# Patient Record
Sex: Female | Born: 1980 | Race: Black or African American | Hispanic: No | Marital: Single
Health system: Midwestern US, Community
[De-identification: ages and names within clinical notes are randomized; demographics above are authoritative.]

## PROBLEM LIST (undated history)

## (undated) DIAGNOSIS — I10 Essential (primary) hypertension: Secondary | ICD-10-CM

## (undated) DIAGNOSIS — Z0000 Encounter for general adult medical examination without abnormal findings: Secondary | ICD-10-CM

## (undated) DIAGNOSIS — E042 Nontoxic multinodular goiter: Secondary | ICD-10-CM

## (undated) DIAGNOSIS — M25519 Pain in unspecified shoulder: Secondary | ICD-10-CM

## (undated) DIAGNOSIS — F900 Attention-deficit hyperactivity disorder, predominantly inattentive type: Secondary | ICD-10-CM

## (undated) DIAGNOSIS — E039 Hypothyroidism, unspecified: Secondary | ICD-10-CM

## (undated) DIAGNOSIS — A5901 Trichomonal vulvovaginitis: Secondary | ICD-10-CM

## (undated) DIAGNOSIS — E041 Nontoxic single thyroid nodule: Secondary | ICD-10-CM

## (undated) DIAGNOSIS — N644 Mastodynia: Secondary | ICD-10-CM

## (undated) DIAGNOSIS — R635 Abnormal weight gain: Secondary | ICD-10-CM

## (undated) DIAGNOSIS — M170 Bilateral primary osteoarthritis of knee: Secondary | ICD-10-CM

## (undated) DIAGNOSIS — Z3040 Encounter for surveillance of contraceptives, unspecified: Secondary | ICD-10-CM

## (undated) DIAGNOSIS — Z131 Encounter for screening for diabetes mellitus: Secondary | ICD-10-CM

## (undated) DIAGNOSIS — F32A Depression, unspecified: Secondary | ICD-10-CM

## (undated) DIAGNOSIS — F419 Anxiety disorder, unspecified: Secondary | ICD-10-CM

## (undated) DIAGNOSIS — E119 Type 2 diabetes mellitus without complications: Secondary | ICD-10-CM

## (undated) DIAGNOSIS — E079 Disorder of thyroid, unspecified: Secondary | ICD-10-CM

## (undated) HISTORY — PX: LAPAROSCOPIC GASTRIC SLEEVE RESECTION: SHX5895

---

## 2009-12-12 MED ADMIN — medroxyPROGESTERone (DEPO-PROVERA) injection 150 mg: 150 mg | INTRAMUSCULAR | @ 21:00:00 | NDC 59762453801

## 2009-12-12 NOTE — Progress Notes (Signed)
Date last pap: 04/04/2009.  Date of last Bone Mineral Density:01/28/2008  Last Depo-Provera: 09/12/2009.  Serum HCG indicated? N/A.  Depo-Provera 150 mg IM given by: SMM.    Patient tolerated injection well with no reaction in clinic.

## 2009-12-12 NOTE — Progress Notes (Signed)
Subjective:      Patient ID: Christine Lynn is a 28 y.o. female.    HPI  Patient presents for Depo Provera injection. No voiced complaints. Prefers to continue Depo Provera.    Review of Systems    Objective:   Physical Exam    Assessment:      Contraceptive surveillance      Plan:      Depo Provera 150mg  IM  RTO in 12 weeks for next injection

## 2010-03-09 LAB — URINALYSIS WITH MICROSCOPIC
Bilirubin, Urine: NEGATIVE
Glucose, UA: NEGATIVE mg/dL
Ketones, Urine: NEGATIVE mg/dL
Leukocyte Esterase, Urine: NEGATIVE
Nitrite, Urine: NEGATIVE
Protein, UA: NEGATIVE mg/dL
Specific Gravity, UA: >=1.03 (ref <=1.035)
Urobilinogen, Urine: 0.2 U/dL (ref 0.2–1.0)
pH, UA: 5 (ref 5.0–8.5)

## 2010-03-09 LAB — POCT URINALYSIS DIPSTICK
Bilirubin, UA: NEGATIVE
Glucose, UA POC: NEGATIVE
Ketones, UA: NEGATIVE
Leukocytes, UA: NEGATIVE
Nitrite, UA: NEGATIVE
Protein, UA POC: NEGATIVE
Spec Grav, UA: 1.03
Urobilinogen, UA: NEGATIVE
pH, UA: 5

## 2010-03-09 MED ADMIN — medroxyPROGESTERone (DEPO-PROVERA) injection 150 mg: 150 mg | INTRAMUSCULAR | @ 21:00:00

## 2010-03-09 NOTE — Progress Notes (Signed)
Subjective:      Patient ID: Christine Lynn is a 29 y.o. female.    HPI  G1 P0 [place] 1 female presents for annual gyn exam and Depo Provera. She would like to continue using Depo Provera.She has used Depo Provera for approximately 10 years. Taking Calcium bid as instructed. Menarche age 73.  She complains of urinary frequency, occasional urgency. No dysuria. Voids small amounts. Positive for nocturia 3-4 times. Symptoms for past 2 months.  Denies pain, vaginal discharge. No bleeding vaginally. History of abnormal pap smears with negative HPV; no colposcopy done. History of chlamydia and trichomoniasis; treated. Sexually active in a monogamous relationship. Last BMD was 1/09, normal.    Review of Systems   Constitutional: Positive for appetite change ("hungry all the time") and unexpected weight change (10 lb weight gain since December 2010).        Complains of excessive thirst, hunger, urination.    Eyes: Negative.    Respiratory: Negative.    Cardiovascular: Negative.    Gastrointestinal: Negative.    Genitourinary: Positive for urgency, frequency and difficulty urinating. Negative for dysuria and flank pain.   Musculoskeletal: Negative.    Skin: Negative.    Neurological: Positive for headaches.   Hematological: Negative.    Psychiatric/Behavioral: The patient is nervous/anxious.        Objective:   Physical Exam   Constitutional: She is oriented to person, place, and time. She appears well-developed and well-nourished.        Obese     HENT:   Head: Normocephalic and atraumatic.   Neck: Normal range of motion. Neck supple. Thyromegaly present.   Cardiovascular: Normal rate and regular rhythm.    Murmur (faint gr 1/6 sytolic at apex and A2) heard.  Pulmonary/Chest: Effort normal and breath sounds normal. Right breast exhibits no inverted nipple, no mass, no nipple discharge, no skin change and no tenderness. Left breast exhibits no inverted nipple, no mass, no nipple discharge, no skin change and no  tenderness. Breasts are symmetrical.   Abdominal: Soft. She exhibits no distension and no mass. No tenderness.   Genitourinary: Vagina normal and uterus normal. There is no rash, tenderness or lesion on the right labia. There is no rash, tenderness or lesion on the left labia. Cervix exhibits no motion tenderness, no discharge and no friability. Right adnexum displays no mass, no tenderness and no fullness. Left adnexum displays no mass, no tenderness and no fullness. No discharge found.        Tender on palpation over bladder wall. Multistick urine POC is positive for small to moderate blood.     Musculoskeletal: Normal range of motion.   Lymphadenopathy:     She has no cervical adenopathy.     She has no axillary adenopathy.   Neurological: She is alert and oriented to person, place, and time.   Skin: Skin is warm and dry.   Psychiatric: She has a normal mood and affect.       Assessment:      Annual gyn exam, breast screening, std screening  Screen for Diabetes Mellitus  Screen for osteoporosis  Screen for thyroid disease  Contraceptive surveillance  UTI screening      Plan:   Thin prep pap smear, GC, CT probes  Depo Provera 150 mg IM; continue Calcium bid  FBS, TFT's  Dexa scan  Multistick urine, UA, c&s  Cipro 250 mg bid for 3 days  Follow up with PCP for preventive medicine management  RTO 12 weeks for next Depo Provera injection

## 2010-03-09 NOTE — Progress Notes (Signed)
Date last pap: 04/04/09.  Date of last Bone Mineral Density:01/28/08  Last Depo-Provera: 12/12/09.  Urine HCG indicated? no.  Depo-Provera 150 mg IM given by: BM left arm.      Marland Kitchen

## 2010-03-12 LAB — GLUCOSE, RANDOM: Glucose: 92 mg/dL (ref 70–110)

## 2010-03-12 LAB — CHLAMYDIA PROBE RNA

## 2010-03-12 LAB — TSH: TSH: 1.498 u[IU]/mL (ref 0.350–5.000)

## 2010-03-12 LAB — GC PROBE RNA

## 2010-03-12 LAB — T4, FREE: T4 Free: 1.29 ng/dL (ref 0.89–1.76)

## 2010-06-01 NOTE — Progress Notes (Signed)
This encounter was created in error - please disregard.

## 2010-06-05 MED ADMIN — medroxyPROGESTERone (DEPO-PROVERA) injection 150 mg: 150 mg | INTRAMUSCULAR | @ 15:00:00

## 2010-06-05 NOTE — Progress Notes (Signed)
Date last pap: 03/09/10.  Date of last Bone Mineral Density:03/27/10  Last Depo-Provera: 03/09/10.  Urine HCG indicated? no.  Depo-Provera 150 mg IM given by: BM right arm.      Marland Kitchen

## 2010-08-28 MED ADMIN — medroxyPROGESTERone (DEPO-PROVERA) injection 150 mg: 150 mg | INTRAMUSCULAR | @ 15:00:00 | NDC 59762453809

## 2010-08-28 NOTE — Progress Notes (Signed)
Date last pap: 03/09/2010 Negative (Annual) .  Date of last Bone Mineral Density:03/27/2010  Last Depo-Provera: 06/05/2010.  Urine HCG indicated? na.  Depo-Provera 150 mg IM given by: Octavia Bruckner.      Marland Kitchen

## 2010-11-19 ENCOUNTER — Encounter

## 2010-11-19 MED ADMIN — medroxyPROGESTERone (DEPO-PROVERA) injection 150 mg: 150 mg | INTRAMUSCULAR | @ 18:00:00 | NDC 59762453809

## 2010-11-19 NOTE — Progress Notes (Signed)
Date last pap: 03/09/2010 Negative.  Date of last Bone Mineral Density:08/2010  Last Depo-Provera:  08/28/2010.  Urine HCG indicated? na.  Depo-Provera 150 mg IM given by: Octavia Bruckner.      Marland Kitchen

## 2011-02-12 MED ADMIN — medroxyPROGESTERone (DEPO-PROVERA) injection 150 mg: INTRAMUSCULAR | @ 15:00:00 | NDC 59762453809

## 2011-02-12 NOTE — Progress Notes (Signed)
Date last pap: 03/09/10.  Date of last Bone Mineral Density:08/2010  Last Depo-Provera: 11/19/10.  Urine HCG indicated? No.  Depo-Provera 150 mg IM given by: bm left arm.      Marland Kitchen

## 2011-05-09 MED ADMIN — medroxyPROGESTERone (DEPO-PROVERA) injection 150 mg: INTRAMUSCULAR | @ 15:00:00 | NDC 59762453809

## 2011-05-09 NOTE — Progress Notes (Signed)
Date of last Pap   03-09-10  Date of last Bone mineral Density   03-27-10  Last Depo   02-12-11  Urine HCG   n/a  Depo Provera given by   MEW

## 2011-06-24 ENCOUNTER — Inpatient Hospital Stay
Admit: 2011-06-24 | Discharge: 2011-06-24 | Disposition: A | Discharge status: Home / Self Care | Attending: Emergency Medicine

## 2011-06-24 MED ORDER — AMOXICILLIN-POT CLAVULANATE 875-125 MG PO TABS
875-125 | ORAL_TABLET | Freq: Two times a day (BID) | ORAL | Status: AC
Start: 2011-06-24 — End: 2011-07-04

## 2011-06-24 MED ADMIN — diptheria-tetanus toxoids 6.7-5 LFU/0.5ML injection 0.5 mL: INTRAMUSCULAR | @ 16:00:00 | NDC 49281027810

## 2011-06-24 NOTE — ED Notes (Signed)
Sling applied- verbal ok by dr Trina Ao for temporary relief of pain  To follow with [facility] (number put on discharge forms)    Linde Gillis, RN  06/24/11 1243

## 2011-06-24 NOTE — ED Notes (Addendum)
Antibiotic changed to doxycycline 100mg  bid x10 days per dr Trina Ao, pt not able to afford augmentin.    Bayard Beaver, RN  06/24/11 1436    Bayard Beaver, RN  06/24/11 [phone]
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
## 2011-06-26 LAB — HEPATITIS B SURFACE ANTIBODY: Hep B S Ab: REACTIVE

## 2011-06-26 LAB — HEPATITIS C ANTIBODY: Hepatitis C Ab: NONREACTIVE

## 2011-06-26 LAB — HIV SCREEN: HIV-1/HIV-2 Ab: NONREACTIVE

## 2011-06-26 LAB — HEPATITIS B SURFACE ANTIGEN: Hepatitis B Surface Ag: NONREACTIVE
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
## 2011-08-06 MED ADMIN — medroxyPROGESTERone (DEPO-PROVERA) injection 150 mg: INTRAMUSCULAR | @ 20:00:00 | NDC 59762453809

## 2011-08-06 NOTE — Progress Notes (Signed)
Date of last Pap   03-09-10  Date of last Bone mineral Density   03-27-10  Last Depo   05-09-11  Urine HCG   n/a  Depo Provera given by   MEW

## 2011-08-06 NOTE — Progress Notes (Signed)
Subjective:      Patient ID: Christine Lynn is a 30 y.o. female.    HPI :Routine Gyn Exam: Patient here for annual exam & medroxy progesterone injection.    Current Complaints: None;Camille would like to discontinue medroxy progesterone use after today's injection & resume OC use in 12 weeks.She denies any history of chest pain,head ache or DVT. PCB: Negative.  GI complaints:Negative. GU complaints:Negative.No c/o vaginal discharge, burning, itching, odor . Pain:0/10.     Gynecologic History  Menarche:@ age 42  No LMP recorded. Patient has had an injection.  Contraception:medroxy progesterone 150 mgm IM every 12 weeks  Last Pap: 02/2010  Results: normal  Bone Mineral Density screening:03/07/2010  Colonoscopy:N/A  Familial history of breast/ovarian cancer:Negative    Obstetric History  Gravida:1  Para: 0  AB:1      Review of Systems   Constitutional: Negative.    HENT: Negative.    Eyes: Negative.    Respiratory: Negative for cough and shortness of breath.    Cardiovascular: Negative for chest pain, palpitations and leg swelling.   Gastrointestinal: Negative.    Genitourinary: Negative for vaginal discharge, pelvic pain and dyspareunia.   Musculoskeletal: Negative.    Neurological: Negative.    Hematological: Negative.    Psychiatric/Behavioral: The patient is nervous/anxious.        Objective:   Physical Exam   Constitutional: She is oriented to person, place, and time. She appears well-developed and well-nourished.        Anxious     HENT:   Head: Normocephalic.   Eyes: Conjunctivae and EOM are normal. Pupils are equal, round, and reactive to light. No scleral icterus.   Neck: Normal range of motion. Neck supple. No JVD present.   Cardiovascular: Normal rate and regular rhythm.    Pulmonary/Chest: Effort normal and breath sounds normal. Right breast exhibits no inverted nipple, no mass, no nipple discharge, no skin change and no tenderness. Left breast exhibits no inverted nipple, no mass, no nipple discharge, no  skin change and no tenderness. Breasts are symmetrical.   Abdominal: Soft. There is no tenderness. There is no guarding.   Genitourinary: Vagina normal and uterus normal. There is no tenderness or lesion on the right labia. There is no tenderness or lesion on the left labia. Uterus is not tender. Cervix exhibits no motion tenderness, no discharge and no friability. Right adnexum displays no tenderness and no fullness. Left adnexum displays no tenderness and no fullness. No erythema around the vagina. No vaginal discharge found.   Musculoskeletal: Normal range of motion. She exhibits no edema and no tenderness.   Neurological: She is alert and oriented to person, place, and time.   Skin: Skin is warm and dry.   Psychiatric: Her speech is normal. Judgment and thought content normal. Her mood appears anxious. She is withdrawn. Cognition and memory are normal.        Nyeli had difficulty cooperating during pelvic exam;she was tense.       Assessment:    Annual gyn exam, breast and std screening  Here for medroxy Progesterone injection.  Family Planning;requests OC prescription in 12 weeks  History of anxiety        Plan:    Thin prep pap smear,HPV screening, GC, CT probes  Medroxy progesterone 150 mgm IM today  Start Junel Fe 1/20 use on 11/03/2011  Calcium 600 mg with vitamin D po bid  RTO in 12 weeks if medroxy progesterone use is desired or annually if  OC use is started

## 2011-08-07 LAB — CHLAMYDIA PROBE RNA

## 2011-08-07 LAB — GC PROBE RNA

## 2011-08-12 NOTE — Telephone Encounter (Signed)
Glennice notified of pap result & need for colposcopy.Colposcopy scheduled for 09/16/11.

## 2011-08-13 LAB — HPV, HIGH RISK: HPV, High Risk: POSITIVE

## 2011-09-16 NOTE — Patient Instructions (Addendum)
[facility] Going Instructions    No sexual relations for 2 weeks    No tampons for  2 weeks    No douching    May have spotting/discharge for 1-7 days    Return to office in 3 months for pap smear

## 2011-09-21 NOTE — Progress Notes (Signed)
Subjective:      Patient ID: Christine Lynn is a 30 y.o. female.    HPI    Indication for colposcopy:   LGSIL on pap smear.    Patient counseled prior to colposcopy re indication, procedure, possible complications, expected findings and subsequent management based on biopsy results. All questions answered.    Review of Systems    Objective:   Physical Exam   Genitourinary:            Specimen: ECC, Cervical biopsies from 3 & 10 o'clock        Assessment:      Colposcopic impression: Agrees with LGSIL      Plan:      Follow up in 3 months for repeat pap smear unless biopsy results require counseling for LEEP

## 2011-10-29 NOTE — Telephone Encounter (Signed)
S/P colposcopy 09/16/11 for evaluation of LGSIL pap.Colposcopy & biopsy results were negative.3 mont f/u pap advised.Sobia states that she wants to discontinue medroxy progesterone & start OC's.Initial dose due 11/03/11.Informed [place] of [facility] office closing effective 11/29/11.Instructed her to make appointment with another women's health Ccare facility for a f/u pap after 12/16/11.

## 2012-02-28 LAB — CBC WITH AUTO DIFFERENTIAL
Absolute Eos #: 0.11 E9/L (ref 0.05–0.50)
Absolute Lymph #: 1.73 E9/L (ref 1.50–4.00)
Absolute Mono #: 0.42 E9/L (ref 0.10–0.95)
Absolute Neut #: 2.91 E9/L (ref 1.80–7.30)
Basophils Absolute: 0.04 E9/L (ref 0.00–0.20)
Basophils: 1 % (ref 0–2)
Eosinophils %: 2 % (ref 0–6)
Hematocrit: 36.4 % (ref 34.0–48.0)
Hemoglobin: 12 g/dL (ref 11.5–15.5)
Lymphocytes: 33 % (ref 20–42)
MCH: 28.8 pg (ref 26.0–35.0)
MCHC: 33 % (ref 32.0–34.5)
MCV: 87.2 fL (ref 80.0–99.9)
MPV: 9.5 fL (ref 7.0–12.0)
Monocytes: 8 % (ref 2–12)
Platelets: 246 E9/L (ref 130–450)
RBC: 4.18 E12/L (ref 3.50–5.50)
RDW: 14 fL (ref 11.5–15.0)
Seg Neutrophils: 56 % (ref 43–80)
WBC: 5.2 E9/L (ref 4.5–11.5)

## 2012-02-28 LAB — COMPREHENSIVE METABOLIC PANEL
ALT: 18 U/L (ref 10–49)
AST: 19 U/L (ref 0–33)
Albumin: 4.3 g/dL (ref 3.2–4.8)
Alkaline Phosphatase: 62 U/L (ref 45–129)
BUN: 11 mg/dL (ref 6–20)
CO2: 28 mmol/L (ref 20–31)
Calcium: 9.8 mg/dL (ref 8.6–10.5)
Chloride: 110 mmol/L — ABNORMAL HIGH (ref 99–109)
Creatinine: 0.9 mg/dL (ref 0.5–1.1)
Glucose: 107 mg/dL (ref 70–110)
Potassium: 4.6 mmol/L (ref 3.5–5.5)
Sodium: 144 mmol/L (ref 132–146)
Total Bilirubin: 0.3 mg/dL (ref 0.3–1.2)
Total Protein: 7.2 g/dL (ref 5.7–8.2)

## 2012-02-28 LAB — TSH: TSH: 2.187 u[IU]/mL (ref 0.350–5.000)

## 2012-02-28 LAB — LIPID PANEL
Cholesterol: 232 mg/dL — ABNORMAL HIGH (ref 0–199)
HDL: 63 mg/dL (ref >40.0)
LDL Calculated: 155 mg/dL — ABNORMAL HIGH (ref 0–99)
Triglycerides: 72 mg/dL (ref 0–149)

## 2012-02-28 LAB — VITAMIN D 25 HYDROX, D2 & D3: Vitamin D2 And D3, Total: 12 ng/mL — ABNORMAL LOW (ref 30–100)

## 2012-02-28 LAB — GFR CALCULATED: Gfr Calculated: >60 mL/min/{1.73_m2} (ref >=60)

## 2012-02-29 ENCOUNTER — Inpatient Hospital Stay
Admit: 2012-02-29 | Discharge: 2012-02-29 | Disposition: A | Discharge status: Home / Self Care | Attending: Emergency Medicine

## 2012-02-29 LAB — CBC WITH AUTO DIFFERENTIAL
Absolute Eos #: 0 E9/L — ABNORMAL LOW (ref 0.05–0.50)
Absolute Lymph #: 0.86 E9/L — ABNORMAL LOW (ref 1.50–4.00)
Absolute Mono #: 0.23 E9/L (ref 0.10–0.95)
Absolute Neut #: 4.62 E9/L (ref 1.80–7.30)
Basophils Absolute: 0 E9/L (ref 0.00–0.20)
Basophils: 0 % (ref 0–2)
Eosinophils %: 0 % (ref 0–6)
Hematocrit: 38.5 % (ref 34.0–48.0)
Hemoglobin: 12.6 g/dL (ref 11.5–15.5)
Lymphocytes: 15 % — ABNORMAL LOW (ref 20–42)
MCH: 28.6 pg (ref 26.0–35.0)
MCHC: 32.8 % (ref 32.0–34.5)
MCV: 87.2 fL (ref 80.0–99.9)
MPV: 9.8 fL (ref 7.0–12.0)
Monocytes: 4 % (ref 2–12)
Platelet Estimate: NORMAL
Platelets: 282 E9/L (ref 130–450)
RBC: 4.41 E12/L (ref 3.50–5.50)
RDW: 12.8 fL (ref 11.5–15.0)
Seg Neutrophils: 81 % — ABNORMAL HIGH (ref 43–80)
WBC: 5.7 E9/L (ref 4.5–11.5)

## 2012-02-29 LAB — URINALYSIS WITH MICROSCOPIC
Glucose, Ur: NEGATIVE mg/dL
Ketones, Urine: 15 mg/dL — AB
Leukocyte Esterase, Urine: NEGATIVE
Nitrite, Urine: NEGATIVE
Protein, UA: 30 mg/dL — AB
Specific Gravity, UA: >=1.03 (ref <=1.035)
Urobilinogen, Urine: 1 E.U./dL (ref 0.2–1.0)
pH, UA: 5.5 (ref 5.0–8.5)

## 2012-02-29 LAB — COMPREHENSIVE METABOLIC PANEL
ALT: 17 U/L (ref 10–49)
AST: 22 U/L (ref 0–33)
Albumin: 4.4 g/dL (ref 3.2–4.8)
Alkaline Phosphatase: 58 U/L (ref 45–129)
BUN: 12 mg/dL (ref 6–20)
CO2: 26 mmol/L (ref 20–31)
Calcium: 9.6 mg/dL (ref 8.6–10.5)
Chloride: 105 mmol/L (ref 99–109)
Creatinine: 0.8 mg/dL (ref 0.5–1.1)
Glucose: 104 mg/dL (ref 70–110)
Potassium: 4.1 mmol/L (ref 3.5–5.5)
Sodium: 140 mmol/L (ref 132–146)
Total Bilirubin: 0.5 mg/dL (ref 0.3–1.2)
Total Protein: 7.7 g/dL (ref 5.7–8.2)

## 2012-02-29 LAB — POC PREGNANCY UR-QUAL: Preg Test, Ur: NEGATIVE

## 2012-02-29 LAB — GFR CALCULATED: Gfr Calculated: >60 mL/min/{1.73_m2} (ref >=60)

## 2012-02-29 LAB — LIPASE: Lipase: 49 U/L (ref 6–51)

## 2012-02-29 MED ORDER — PROMETHAZINE HCL 25 MG PO TABS
25 MG | ORAL_TABLET | ORAL | Status: AC
Start: 2012-02-29 — End: 2012-03-05

## 2012-02-29 MED ADMIN — sodium chloride 0.9% bolus: INTRAVENOUS | @ 08:00:00 | NDC 00338004904

## 2012-02-29 MED ADMIN — ondansetron (ZOFRAN) injection 8 mg: INTRAVENOUS | @ 08:00:00 | NDC 00641607801

## 2012-02-29 MED FILL — ONDANSETRON HCL 4 MG/2ML IJ SOLN: 4 MG/2ML | INTRAMUSCULAR | Qty: 4

## 2012-02-29 MED FILL — SODIUM CHLORIDE 0.9 % IV SOLN: 0.9 % | INTRAVENOUS | Qty: 1000

## 2012-02-29 NOTE — ED Provider Notes (Signed)
HPI Comments: Patient comes in with a complaint of nausea and vomiting that started yesterday at 6 PM. Patient denies any abdominal pain or diarrhea. She denies any fevers or chills. She denies any history of alcohol use or gallstones. Patient's last menstrual period was roughly 2 weeks ago. She states his had at least 6 or 7 episodes of emesis. She denies any dizziness or lightheadedness. No blood in the emesis.    Patient is a 31 y.o. female presenting with GI illness. The history is provided by the patient.   GI Problem  The primary symptoms include nausea and vomiting. Primary symptoms do not include fever, fatigue, abdominal pain, diarrhea, dysuria or rash.   The illness does not include chills or back pain.       Review of Systems   Constitutional: Negative for fever, chills and fatigue.   Eyes: Negative for visual disturbance.   Respiratory: Negative for shortness of breath.    Cardiovascular: Negative for chest pain.   Gastrointestinal: Positive for nausea and vomiting. Negative for abdominal pain, diarrhea and blood in stool.   Genitourinary: Negative for dysuria, urgency, frequency, hematuria, vaginal bleeding and vaginal discharge.   Musculoskeletal: Negative for back pain.   Skin: Negative for rash.   Neurological: Negative for dizziness, syncope and light-headedness.       Physical Exam   Nursing note and vitals reviewed.  Constitutional: She is oriented to person, place, and time. She appears well-developed and well-nourished. No distress.   HENT:   Head: Normocephalic and atraumatic.   Eyes: Conjunctivae are normal. Pupils are equal, round, and reactive to light.   Neck: Normal range of motion. Neck supple.   Cardiovascular: Normal rate, regular rhythm and normal heart sounds.    No murmur heard.  Pulmonary/Chest: Effort normal and breath sounds normal. No respiratory distress.   Abdominal: Soft. Bowel sounds are normal. There is no tenderness.   Neurological: She is alert and oriented to person,  place, and time.   Skin: Skin is warm and dry. No rash noted. She is not diaphoretic.       Procedures    MDM      --------------------------------------------- PAST HISTORY ---------------------------------------------  Past Medical History:  has a past medical history of Hypertension and Anxiety.    Past Surgical History:  has no past surgical history on file.    Social History:  reports that she has never smoked. She has never used smokeless tobacco. She reports that she does not drink alcohol or use illicit drugs.    Family History: family history includes Cancer in her maternal grandfather, maternal grandmother, and another family member and Diabetes in her mother and other family members.     The patient's home medications have been reviewed.    Allergies: Review of patient's allergies indicates no known allergies.    -------------------------------------------------- RESULTS -------------------------------------------------  Labs:  Results for orders placed during the hospital encounter of 02/29/12   COMPREHENSIVE METABOLIC PANEL       Result Value Range    Sodium 140  132 - 146 mmol/L    Potassium 4.1  3.5 - 5.5 mmol/L    Chloride 105  99 - 109 mmol/L    CO2 26  20 - 31 mmol/L    Glucose 104  70 - 110 mg/dL    BUN 12  6 - 20 mg/dL    Creatinine, Ser 0.8  0.5 - 1.1 mg/dL    Calcium 9.6  8.6 - 62.9 mg/dL  Total Protein 7.7  5.7 - 8.2 g/dL    Alb 4.4  3.2 - 4.8 g/dL    Alkaline Phosphatase 58  45 - 129 U/L    AST 22  0 - 33 U/L    Total Bilirubin 0.5  0.3 - 1.2 mg/dL    ALT 17  10 - 49 U/L   CBC WITH AUTO DIFFERENTIAL       Result Value Range    WBC 5.7  4.5 - 11.5 E9/L    RBC 4.41  3.50 - 5.50 E12/L    Hemoglobin 12.6  11.5 - 15.5 g/dL    Hematocrit 36.6  44.0 - 48.0 %    MCV 87.2  80.0 - 99.9 fL    MCH 28.6  26.0 - 35.0 pg    MCHC 32.8  32.0 - 34.5 %    RDW 12.8  11.5 - 15.0 fL    Platelets 282  130 - 450 E9/L    MPV 9.8  7.0 - 12.0 fL    Seg Neutrophils 81 (*) 43 - 80 %    Lymphocytes 15 (*) 20 - 42 %     Monocytes 4  2 - 12 %    Eosinophils 0  0 - 6 %    Basophils 0  0 - 2 %    Absolute Neut # 4.62  1.80 - 7.30 E9/L    Absolute Lymph # 0.86 (*) 1.50 - 4.00 E9/L    Absolute Mono # 0.23  0.10 - 0.95 E9/L    Absolute Eos # 0.00 (*) 0.05 - 0.50 E9/L    Basophils Absolute 0.00  0.00 - 0.20 E9/L    Platelet Estimate Normal      RBC Morphology Norm RBC morph     LIPASE       Result Value Range    Lipase 49  6 - 51 U/L   POC PREGNANCY UR-QUAL       Result Value Range    Preg Test, Ur negative      QC OK?       URINALYSIS WITH MICROSCOPIC       Result Value Range    Color, UA DKYELLOW  YELLOW    Clarity, UA CLOUDY  CLEAR    Glucose, Ur NEGATIVE  NEGATIVE mg/dL    Bilirubin Urine SMALL (*) NEGATIVE    Ketones, Urine 15 (*) NEGATIVE mg/dL    Specific Gravity, UA >=1.030  <=1.035    Blood, Urine LARGE (*) NEGATIVE    pH, UA 5.5  5.0 - 8.5    Protein, UA 30 (*) NEGATIVE mg/dL    Urobilinogen, Urine 1.0  0.2 - 1.0 E.U./dL    Nitrite, Urine NEGATIVE  NEGATIVE    Leukocyte Esterase, Urine NEGATIVE  NEGATIVE    WBC, UA 1-3  <5 /hpf    RBC, UA 1-3  <2 /hpf    Bacteria, UA FEW (*) NONE /hpf    Amorphous, UA MANY     GFR CALCULATED       Result Value Range    Gfr Calculated >60  >=60 ml/mn/1.73         ------------------------- NURSING NOTES AND VITALS REVIEWED ---------------------------  The nursing notes within the ED encounter and vital signs as below have been reviewed.   BP 141/96  Pulse 97  Temp(Src) 98.5 F (36.9 C) (Oral)  Resp 10  Ht 5\' 7"  (1.702 m)  Wt 264 lb (119.75 kg)  BMI 41.34 kg/m2  SpO2 100%  LMP 02/21/2012  Oxygen Saturation Interpretation: Normal      ------------------------------------------ PROGRESS NOTES ------------------------------------------  I have spoken with the patient and discussed today's results, in addition to providing specific details for the plan of care and counseling regarding the diagnosis and prognosis.  Their questions are answered at this time and they are agreeable with the  plan.    [id number]  Patient states her symptoms have improved with treatment.  She was tolerating by mouth fluids in the department without emesis.    --------------------------------- ADDITIONAL PROVIDER NOTES ---------------------------------  At this time the patient is without objective evidence of an acute process requiring hospitalization or inpatient management.  They have remained hemodynamically stable throughout their entire ED visit and are stable for discharge with outpatient follow-up.     The plan has been discussed in detail and they are aware of the specific conditions for emergent return, as well as the importance of follow-up.      New Prescriptions    PROMETHAZINE (PHENERGAN) 25 MG TABLET    Take 1 tablet by mouth every 4 hours for 5 days.       Diagnosis:  1. Nausea & vomiting        Disposition:  Patient's disposition: Discharge to home  Patient's condition is stable.          Nils Pyle, DO  02/29/12 [phone]

## 2012-02-29 NOTE — Discharge Instructions (Signed)
Nausea and Vomiting  Nausea is a sick feeling that often comes before throwing up (vomiting). Vomiting is a reflex where stomach contents come out of your mouth. Vomiting can cause severe loss of body fluids (dehydration). Children and elderly adults can become dehydrated quickly, especially if they also have diarrhea. Nausea and vomiting are symptoms of a condition or disease. It is important to find the cause of your symptoms.  CAUSES    Direct irritation of the stomach lining. This irritation can result from increased acid production (gastroesophageal reflux disease), infection, food poisoning, taking certain medicines (such as nonsteroidal anti-inflammatory drugs), alcohol use, or tobacco use.   Signals from the brain.These signals could be caused by a headache, heat exposure, an inner ear disturbance, increased pressure in the brain from injury, infection, a tumor, or a concussion, pain, emotional stimulus, or metabolic problems.   An obstruction in the gastrointestinal tract (bowel obstruction).   Illnesses such as diabetes, hepatitis, gallbladder problems, appendicitis, kidney problems, cancer, sepsis, atypical symptoms of a heart attack, or eating disorders.   Medical treatments such as chemotherapy and radiation.   Receiving medicine that makes you sleep (general anesthetic) during surgery.  DIAGNOSIS  Your caregiver may ask for tests to be done if the problems do not improve after a few days. Tests may also be done if symptoms are severe or if the reason for the nausea and vomiting is not clear. Tests may include:   Urine tests.   Blood tests.   Stool tests.   Cultures (to look for evidence of infection).   X-rays or other imaging studies.  Test results can help your caregiver make decisions about treatment or the need for additional tests.  TREATMENT  You need to stay well hydrated. Drink frequently but in small amounts.You may wish to drink water, sports drinks, clear broth, or eat frozen  ice pops or gelatin dessert to help stay hydrated.When you eat, eating slowly may help prevent nausea.There are also some antinausea medicines that may help prevent nausea.  HOME CARE INSTRUCTIONS    Take all medicine as directed by your caregiver.   If you do not have an appetite, do not force yourself to eat. However, you must continue to drink fluids.   If you have an appetite, eat a normal diet unless your caregiver tells you differently.   Eat a variety of complex carbohydrates (rice, wheat, potatoes, bread), lean meats, yogurt, fruits, and vegetables.   Avoid high-fat foods because they are more difficult to digest.   Drink enough water and fluids to keep your urine clear or pale yellow.   If you are dehydrated, ask your caregiver for specific rehydration instructions. Signs of dehydration may include:   Severe thirst.   Dry lips and mouth.   Dizziness.   Dark urine.   Decreasing urine frequency and amount.   Confusion.   Rapid breathing or pulse.  SEEK IMMEDIATE MEDICAL CARE IF:    You have blood or brown flecks (like coffee grounds) in your vomit.   You have black or bloody stools.   You have a severe headache or stiff neck.   You are confused.   You have severe abdominal pain.   You have chest pain or trouble breathing.   You do not urinate at least once every 8 hours.   You develop cold or clammy skin.   You continue to vomit for longer than 24 to 48 hours.   You have a fever.  MAKE SURE YOU:      Understand these instructions.   Will watch your condition.   Will get help right away if you are not doing well or get worse.  Document Released: 12/16/2005 Document Revised: 12/05/2011 Document Reviewed: 05/15/2011  [employer] Patient Information 2012 [employer].

## 2012-03-21 ENCOUNTER — Inpatient Hospital Stay
Admit: 2012-03-21 | Discharge: 2012-03-22 | Disposition: A | Discharge status: Home / Self Care | Attending: Emergency Medicine

## 2012-03-21 NOTE — ED Provider Notes (Signed)
HPI Comments: Patient is a 31 y/o female who presents to the ED with abdominal pain, nausea, and vomiting. Patient states she was seen here 2 weeks ago for the same thing, prescribed phenergan, however her symptoms continue. She states she is unable to hold anything down. She also reports a fever. She denies any diarrhea, dysuria, or vaginal discharge.    Patient is a 31 y.o. female presenting with abdominal pain. The history is provided by the patient.   Abdominal Pain  The primary symptoms of the illness include abdominal pain, nausea and vomiting. The primary symptoms of the illness do not include fever, shortness of breath, diarrhea or dysuria. The current episode started more than 2 days ago. The onset of the illness was gradual. The problem has not changed since onset.  The abdominal pain is generalized. The abdominal pain does not radiate. The severity of the abdominal pain is 5/10. The abdominal pain is relieved by nothing. The abdominal pain is exacerbated by eating and vomiting.   The patient states that she believes she is currently not pregnant. The patient has not had a change in bowel habit. Symptoms associated with the illness do not include chills, frequency or back pain.       Review of Systems   Constitutional: Negative for fever and chills.   HENT: Negative for ear pain, sore throat and sinus pressure.    Eyes: Negative for pain, discharge and redness.   Respiratory: Negative for cough, shortness of breath and wheezing.    Cardiovascular: Negative for chest pain.   Gastrointestinal: Positive for nausea, vomiting and abdominal pain. Negative for diarrhea and abdominal distention.   Genitourinary: Negative for dysuria and frequency.   Musculoskeletal: Negative for back pain and arthralgias.   Skin: Negative for rash and wound.   Neurological: Negative for weakness and headaches.   Hematological: Negative for adenopathy.   All other systems reviewed and are negative.        Physical Exam   Nursing  note and vitals reviewed.  Constitutional: She is oriented to person, place, and time. She appears well-developed and well-nourished. No distress.   HENT:   Head: Normocephalic and atraumatic.   Nose: Nose normal.   Mouth/Throat: Oropharynx is clear and moist.   Eyes: Conjunctivae and EOM are normal. Pupils are equal, round, and reactive to light.   Neck: Normal range of motion. Neck supple.   Cardiovascular: Normal rate, regular rhythm, normal heart sounds and intact distal pulses.    No murmur heard.  Pulmonary/Chest: Effort normal and breath sounds normal. No respiratory distress. She has no wheezes. She has no rales.   Abdominal: Soft. Bowel sounds are normal. She exhibits no distension. There is tenderness (Diffuse). There is no rebound and no guarding.   Musculoskeletal: Normal range of motion. She exhibits no edema.   Neurological: She is alert and oriented to person, place, and time.   Skin: Skin is warm and dry. No rash noted. She is not diaphoretic.   Psychiatric: She has a normal mood and affect.       Procedures    MDM    Time: 0300  Re-evaluation.  Patient???s symptoms are improving  Repeat physical examination is improved    --------------------------------------------- PAST HISTORY ---------------------------------------------  Past Medical History:  has a past medical history of Hypertension and Anxiety.    Past Surgical History:  has no past surgical history on file.    Social History:  reports that she has never smoked. She has  never used smokeless tobacco. She reports that she does not drink alcohol or use illicit drugs.    Family History: family history includes Cancer in her maternal grandfather, maternal grandmother, and another family member and Diabetes in her mother and other family members.     The patient???s home medications have been reviewed.    Allergies: Review of patient's allergies indicates no known allergies.    -------------------------------------------------- RESULTS  -------------------------------------------------  Labs:  Results for orders placed during the hospital encounter of 03/21/12   CBC WITH AUTO DIFFERENTIAL       Result Value Range    WBC 4.4 (*) 4.5 - 11.5 E9/L    RBC 4.46  3.50 - 5.50 E12/L    Hemoglobin 12.6  11.5 - 15.5 g/dL    Hematocrit 38.4  34.0 - 48.0 %    MCV 86.0  80.0 - 99.9 fL    MCH 28.3  26.0 - 35.0 pg    MCHC 32.9  32.0 - 34.5 %    RDW 13.5  11.5 - 15.0 fL    Platelets 278  130 - 450 E9/L    MPV 9.5  7.0 - 12.0 fL    Absolute Neut # 2.04  1.80 - 7.30 E9/L    Absolute Lymph # 1.71  1.50 - 4.00 E9/L    Absolute Mono # 0.47  0.10 - 0.95 E9/L    Absolute Eos # 0.18  0.05 - 0.50 E9/L    Basophils Absolute 0.03  0.00 - 0.20 E9/L    Seg Neutrophils 46  43 - 80 %    Lymphocytes 39  20 - 42 %    Monocytes 11  2 - 12 %    Eosinophils 4  0 - 6 %    Basophils 1  0 - 2 %    Platelet Estimate Normal      RBC Morphology Norm RBC morph     COMPREHENSIVE METABOLIC PANEL       Result Value Range    Sodium 140  132 - 146 mmol/L    Potassium 3.6  3.5 - 5.5 mmol/L    Chloride 100  99 - 109 mmol/L    CO2 30  20 - 31 mmol/L    Glucose 118 (*) 70 - 110 mg/dL    BUN 12  6 - 20 mg/dL    Creatinine, Ser 1.0  0.5 - 1.1 mg/dL    Calcium 9.8  8.6 - 10.5 mg/dL    Total Protein 7.7  5.7 - 8.2 g/dL    Alb 4.4  3.2 - 4.8 g/dL    Alkaline Phosphatase 64  45 - 129 U/L    AST 23  0 - 33 U/L    Total Bilirubin 0.2 (*) 0.3 - 1.2 mg/dL    ALT 23  10 - 49 U/L   LIPASE       Result Value Range    Lipase 76 (*) 6 - 51 U/L   LACTIC ACID, PLASMA       Result Value Range    Lactic Acid 1.2  0.5 - 2.2 mmol/L   URINALYSIS WITH MICROSCOPIC       Result Value Range    Color, UA YELLOW  YELLOW    Clarity, UA SLCLOUDY  CLEAR    Glucose, Ur NEGATIVE  NEGATIVE mg/dL    Bilirubin Urine NEGATIVE  NEGATIVE    Ketones, Urine NEGATIVE  NEGATIVE mg/dL    Specific Gravity, UA >=1.030  <=1.035  Blood, Urine TRACE (*) NEGATIVE    pH, UA 6.0  5.0 - 8.5    Protein, UA NEGATIVE  NEGATIVE mg/dL    Urobilinogen,  Urine 0.2  0.2 - 1.0 E.U./dL    Nitrite, Urine NEGATIVE  NEGATIVE    Leukocyte Esterase, Urine NEGATIVE  NEGATIVE    WBC, UA 0-1  <5 /hpf    RBC, UA 0-1  <2 /hpf    Bacteria, UA RARE (*) NONE /hpf    Squam Epithel, UA MODERATE     GFR CALCULATED       Result Value Range    Gfr Calculated >60  >=60 ml/mn/1.73       Radiology:  CT abdomen/pelvis with IV and oral contrast: Negative    ------------------------- NURSING NOTES AND VITALS REVIEWED ---------------------------  The nursing notes within the ED encounter and vital signs as below have been reviewed.   BP 136/80   Pulse 90   Temp(Src) 98.3 ??F (36.8 ??C) (Oral)   Resp 18   Ht 5' 8"$  (1.727 m)   Wt 264 lb (119.75 kg)   BMI 40.15 kg/m2   SpO2 99%   LMP 01/21/2012  Oxygen Saturation Interpretation: Normal      ------------------------------------------ PROGRESS NOTES ------------------------------------------  I have spoken with the patient and mother and discussed today???s results, in addition to providing specific details for the plan of care and counseling regarding the diagnosis and prognosis.  Their questions are answered at this time and they are agreeable with the plan.      --------------------------------- ADDITIONAL PROVIDER NOTES ---------------------------------  At this time the patient is without objective evidence of an acute process requiring hospitalization or inpatient management.  They have remained hemodynamically stable throughout their entire ED visit and are stable for discharge with outpatient follow-up.     The plan has been discussed in detail and they are aware of the specific conditions for emergent return, as well as the importance of follow-up.      New Prescriptions    No medications on file       Diagnosis:  1. Abdominal pain        Disposition:  Patient's disposition: Discharge to home  Patient's condition is stable.                [place], DO  03/22/12 [phone]

## 2012-03-22 LAB — CBC WITH AUTO DIFFERENTIAL
Absolute Eos #: 0.18 E9/L (ref 0.05–0.50)
Absolute Lymph #: 1.71 E9/L (ref 1.50–4.00)
Absolute Mono #: 0.47 E9/L (ref 0.10–0.95)
Absolute Neut #: 2.04 E9/L (ref 1.80–7.30)
Basophils Absolute: 0.03 E9/L (ref 0.00–0.20)
Basophils: 1 % (ref 0–2)
Eosinophils %: 4 % (ref 0–6)
Hematocrit: 38.4 % (ref 34.0–48.0)
Hemoglobin: 12.6 g/dL (ref 11.5–15.5)
Lymphocytes: 39 % (ref 20–42)
MCH: 28.3 pg (ref 26.0–35.0)
MCHC: 32.9 % (ref 32.0–34.5)
MCV: 86 fL (ref 80.0–99.9)
MPV: 9.5 fL (ref 7.0–12.0)
Monocytes: 11 % (ref 2–12)
Platelet Estimate: NORMAL
Platelets: 278 E9/L (ref 130–450)
RBC: 4.46 E12/L (ref 3.50–5.50)
RDW: 13.5 fL (ref 11.5–15.0)
Seg Neutrophils: 46 % (ref 43–80)
WBC: 4.4 E9/L — ABNORMAL LOW (ref 4.5–11.5)

## 2012-03-22 LAB — COMPREHENSIVE METABOLIC PANEL
ALT: 23 U/L (ref 10–49)
AST: 23 U/L (ref 0–33)
Albumin: 4.4 g/dL (ref 3.2–4.8)
Alkaline Phosphatase: 64 U/L (ref 45–129)
BUN: 12 mg/dL (ref 6–20)
CO2: 30 mmol/L (ref 20–31)
Calcium: 9.8 mg/dL (ref 8.6–10.5)
Chloride: 100 mmol/L (ref 99–109)
Creatinine: 1 mg/dL (ref 0.5–1.1)
Glucose: 118 mg/dL — ABNORMAL HIGH (ref 70–110)
Potassium: 3.6 mmol/L (ref 3.5–5.5)
Sodium: 140 mmol/L (ref 132–146)
Total Bilirubin: 0.2 mg/dL — ABNORMAL LOW (ref 0.3–1.2)
Total Protein: 7.7 g/dL (ref 5.7–8.2)

## 2012-03-22 LAB — URINALYSIS WITH MICROSCOPIC
Bilirubin Urine: NEGATIVE
Glucose, Ur: NEGATIVE mg/dL
Ketones, Urine: NEGATIVE mg/dL
Leukocyte Esterase, Urine: NEGATIVE
Nitrite, Urine: NEGATIVE
Protein, UA: NEGATIVE mg/dL
Specific Gravity, UA: >=1.03 (ref <=1.035)
Urobilinogen, Urine: 0.2 E.U./dL (ref 0.2–1.0)
pH, UA: 6 (ref 5.0–8.5)

## 2012-03-22 LAB — LACTIC ACID: Lactic Acid: 1.2 mmol/L (ref 0.5–2.2)

## 2012-03-22 LAB — LIPASE: Lipase: 76 U/L — ABNORMAL HIGH (ref 6–51)

## 2012-03-22 LAB — GFR CALCULATED: Gfr Calculated: >60 mL/min/{1.73_m2} (ref >=60)

## 2012-03-22 MED ADMIN — 0.9 % sodium chloride infusion: INTRAVENOUS | @ 01:00:00 | NDC 00338004904

## 2012-03-22 MED ADMIN — Iohexol injection 50 mL: ORAL | @ 05:00:00 | NDC 00407141230

## 2012-03-22 MED ADMIN — ioversol (OPTIRAY) 68 % injection 100 mL: INTRAVENOUS | @ 05:00:00 | NDC 00019132311

## 2012-03-22 MED ADMIN — ketorolac (TORADOL) injection 30 mg: INTRAVENOUS | @ 01:00:00 | NDC 00409379501

## 2012-03-22 MED ADMIN — ondansetron (ZOFRAN) injection 8 mg: INTRAVENOUS | @ 01:00:00 | NDC 00641607801

## 2012-03-22 MED FILL — KETOROLAC TROMETHAMINE 30 MG/ML IJ SOLN: 30 MG/ML | INTRAMUSCULAR | Qty: 1

## 2012-03-22 MED FILL — ONDANSETRON HCL 4 MG/2ML IJ SOLN: 4 MG/2ML | INTRAMUSCULAR | Qty: 4

## 2012-03-22 MED FILL — SODIUM CHLORIDE 0.9 % IV SOLN: 0.9 % | INTRAVENOUS | Qty: 1000

## 2012-03-22 NOTE — Discharge Instructions (Signed)
Abdominal Pain (Nonspecific)  Your exam might not show the exact reason you have abdominal pain. Since there are many different causes of abdominal pain, another checkup and more tests may be needed. It is very important to follow up for lasting (persistent) or worsening symptoms. A possible cause of abdominal pain in any person who still has his or her appendix is acute appendicitis. Appendicitis is often hard to diagnose. Normal blood tests, urine tests, ultrasound, and CT scans do not completely rule out early appendicitis or other causes of abdominal pain. Sometimes, only the changes that happen over time will allow appendicitis and other causes of abdominal pain to be determined. Other potential problems that may require surgery may also take time to become more apparent. Because of this, it is important that you follow all of the instructions below.  HOME CARE INSTRUCTIONS    Rest as much as possible.   Do not eat solid food until your pain is gone.   While adults or children have pain: A diet of water, weak decaffeinated tea, broth or bouillon, gelatin, oral rehydration solutions (ORS), frozen ice pops, or ice chips may be helpful.   When pain is gone in adults or children: Start a light diet (dry toast, crackers, applesauce, or white rice). Increase the diet slowly as long as it does not bother you. Eat no dairy products (including cheese and eggs) and no spicy, fatty, fried, or high-fiber foods.   Use no alcohol, caffeine, or cigarettes.   Take your regular medicines unless your caregiver told you not to.   Take any prescribed medicine as directed.   Only take over-the-counter or prescription medicines for pain, discomfort, or fever as directed by your caregiver. Do not give aspirin to children.  If your caregiver has given you a follow-up appointment, it is very important to keep that appointment. Not keeping the appointment could result in a permanent injury and/or lasting (chronic) pain and/or  disability. If there is any problem keeping the appointment, you must call to reschedule.   SEEK IMMEDIATE MEDICAL CARE IF:    Your pain is not gone in 24 hours.   Your pain becomes worse, changes location, or feels different.   You or your child has an oral temperature above 102 F (38.9 C), not controlled by medicine.   Your baby is older than 3 months with a rectal temperature of 102 F (38.9 C) or higher.   Your baby is 3 months old or younger with a rectal temperature of 100.4 F (38 C) or higher.   You have shaking chills.   You keep throwing up (vomiting) or cannot drink liquids.   There is blood in your vomit or you see blood in your bowel movements.   Your bowel movements become dark or black.   You have frequent bowel movements.   Your bowel movements stop (become blocked) or you cannot pass gas.   You have bloody, frequent, or painful urination.   You have yellow discoloration in the skin or whites of the eyes.   Your stomach becomes bloated or bigger.   You have dizziness or fainting.   You have chest or back pain.  MAKE SURE YOU:    Understand these instructions.   Will watch your condition.   Will get help right away if you are not doing well or get worse.  Document Released: 12/16/2005 Document Revised: 12/05/2011 Document Reviewed: 11/13/2009  [employer] Patient Information 2012 [employer]

## 2012-03-22 NOTE — ED Notes (Signed)
Ct aware of when contrast finished    Marciano Sequin, RN  03/22/12 [phone]

## 2012-04-29 ENCOUNTER — Inpatient Hospital Stay
Admit: 2012-04-29 | Discharge: 2012-04-29 | Disposition: A | Discharge status: Home / Self Care | Attending: Emergency Medicine

## 2012-04-29 MED ORDER — IBUPROFEN 800 MG PO TABS
800 MG | ORAL_TABLET | Freq: Three times a day (TID) | ORAL | Status: DC | PRN
Start: 2012-04-29 — End: 2013-09-01
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
## 2012-04-29 NOTE — Discharge Instructions (Signed)
Contusion  A contusion is a deep bruise. Contusions are the result of an injury that caused bleeding under the skin. The contusion may turn blue, purple, or yellow. Minor injuries will give you a painless contusion, but more severe contusions may stay painful and swollen for a few weeks.   CAUSES   A contusion is usually caused by a blow, trauma, or direct force to an area of the body.  SYMPTOMS    Swelling and redness of the injured area.   Bruising of the injured area.   Tenderness and soreness of the injured area.   Pain.  DIAGNOSIS   The diagnosis can be made by taking a history and physical exam. An X-ray, CT scan, or MRI may be needed to determine if there were any associated injuries, such as fractures.  TREATMENT   Specific treatment will depend on what area of the body was injured. In general, the best treatment for a contusion is resting, icing, elevating, and applying cold compresses to the injured area. Over-the-counter medicines may also be recommended for pain control. Ask your caregiver what the best treatment is for your contusion.  HOME CARE INSTRUCTIONS    Put ice on the injured area.   Put ice in a plastic bag.   Place a towel between your skin and the bag.   Leave the ice on for 15 to 20 minutes, 3 to 4 times a day.   Only take over-the-counter or prescription medicines for pain, discomfort, or fever as directed by your caregiver. Your caregiver may recommend avoiding anti-inflammatory medicines (aspirin, ibuprofen, and naproxen) for 48 hours because these medicines may increase bruising.   Rest the injured area.   If possible, elevate the injured area to reduce swelling.  SEEK IMMEDIATE MEDICAL CARE IF:    You have increased bruising or swelling.   You have pain that is getting worse.   Your swelling or pain is not relieved with medicines.  MAKE SURE YOU:    Understand these instructions.   Will watch your condition.   Will get help right away if you are not doing well or get  worse.  Document Released: 09/25/2005 Document Revised: 12/05/2011 Document Reviewed: 10/21/2011  [facility] Patient Information 2012 [place], [place].    Soft Tissue Injury of the Neck  A soft tissue injury of the neck may be either blunt or penetrating. A blunt injury does not break the skin. A penetrating injury breaks the skin, creating an open wound. Blunt injuries may happen in several ways. Most involve some type of direct blow to the neck. This can cause serious injury to the windpipe, voice box, cervical spine, or esophagus. In some cases, the injury to the soft tissue can also result in a break (fracture) of the cervical spine.   Soft tissue injuries of the neck require immediate medical care. Sometimes, you may not notice the signs of injury right away. You may feel fine at first, but the swelling may eventually close off your airway. This could result in a significant or life-threatening injury. This is rare, but it is important to keep in mind with any injury to the neck.   CAUSES   Causes of blunt injury may include:   "Clothesline" injuries. This happens when someone is moving at high speed and runs into a clothesline, outstretched arm, or similar object. This results in a direct injury to the front of the neck. If the airway is blocked, it can cause suffocation due to lack of  oxygen (asphyxiation) or even instant death.   High-energy trauma. This includes injuries from motor vehicle crashes, falling from a great height, or heavy objects falling onto the neck.   Sports-related injuries. Injury to the windpipe and voice box can result from being struck by another player or being struck by an object, such as a baseball, hockey stick, or an outstretched arm.   Strangulation. This type of injury may cause skin trauma, hoarseness of voice, or broken cartilage in the voice box or windpipe. It may also cause a serious airway problem.  SYMPTOMS    Bruising.   Pain and tenderness in the neck.   Swelling of  the neck and face.   Hoarseness of voice.   Pain or difficulty with swallowing.   Drooling or inability to swallow.   Trouble breathing. This may become worse when lying flat.   Coughing up blood.   High-pitched, harsh, vibratory noise due to partial obstruction of the windpipe (stridor).   Swelling of the upper arms.   Windpipe that appears to be pushed off to one side.   Air in the tissues under the skin of the neck or chest (subcutaneous emphysema). This usually indicates a problem with the normal airway and is a medical emergency.  DIAGNOSIS    If possible, your caregiver may ask about the details of how the injury occurred. A detailed exam can help to identify specific areas of the neck that are injured.   Your caregiver may ask for tests to rule out injury of the voice box, airway, or esophagus. This may include X-rays, ultrasounds, CT scans, or MRI scans, depending on the severity of your injury.  TREATMENT   If you have an injury to your windpipe or voice box, immediate medical care is required. In almost all cases, hospitalization is necessary. For injuries that do not appear to require surgery, it is helpful to have medical observation for 24 hours. You may be asked to do one or more of the following:   Rest your voice.   Bed rest.   Limit your diet, depending on the extent of the injury. Follow your caregiver's dietary guidelines. Often, only fluids and soft foods are recommended.   Keep your head raised.   Breathe humidified air.   Take medicines to control infection, reduce swelling, and reduce normal stomach acid. You may also need pain medicine, depending on your injury.  For injuries that appear to require surgery, you will need to stay in the hospital. The exact type of procedure needed will depend on your exact injury or injuries.   HOME CARE INSTRUCTIONS    If the skin was broken, keep the wound area clean and dry. Wear your bandage (dressing) and care for your wound as  instructed.   Follow your caregiver's advice about your diet.   Follow your caregiver's advice about use of your voice.   Take medicines as directed.   Keep your head and neck at least partially raised (elevated) while recovering. This should also be done while sleeping.  SEEK MEDICAL CARE IF:    Your voice becomes weaker.   Your swelling or bruising is not improving as expected. Typically, this takes several days to improve.   You feel that you are having problems with medicines prescribed.   You have drainage from the injury site. This may be a sign that your wound is not healing properly or is infected.   You develop increasing pain or difficulty while swallowing.  You develop an oral temperature of 102 F (38.9 C) or higher.  SEEK IMMEDIATE MEDICAL CARE IF:    You cough up blood.   You develop sudden trouble breathing.   You cannot tolerate your oral medicines, or you are unable to swallow.   You develop drooling.   You have new or worsening vomiting.   You develop sudden, new swelling of the neck or face.   You have an oral temperature above 102 F (38.9 C), not controlled by medicine.  MAKE SURE YOU:   Understand these instructions.   Will watch your condition.   Will get help right away if you are not doing well or get worse.  Document Released: 03/24/2008 Document Revised: 12/05/2011 Document Reviewed: 03/04/2011  [facility] Patient Information 2012 [place].    Head Injury, Adult  You have had a head injury that does not appear serious at this time. A concussion is a state of changed mental ability, usually from a blow to the head. You should take clear liquids for the rest of the day and then resume your regular diet. You should not take sedatives or alcoholic beverages for as long as directed by your caregiver after discharge. After injuries such as yours, most problems occur within the first 24 hours.  SYMPTOMS  These minor symptoms may be experienced after  discharge:   Memory difficulties.   Dizziness.   Headaches.   Double vision.   Hearing difficulties.   Depression.   Tiredness.   Weakness.   Difficulty with concentration.  If you experience any of these problems, you should not be alarmed. A concussion requires a few days for recovery. Many patients with head injuries frequently experience such symptoms. Usually, these problems disappear without medical care. If symptoms last for more than one day, notify your caregiver. See your caregiver sooner if symptoms are becoming worse rather than better.  HOME CARE INSTRUCTIONS    During the next 24 hours you must stay with someone who can watch you for the warning signs listed below.  Although it is unlikely that serious side effects will occur, you should be aware of signs and symptoms which may necessitate your return to this location. Side effects may occur up to 7 - 10 days following the injury. It is important for you to carefully monitor your condition and contact your caregiver or seek immediate medical attention if there is a change in your condition.  SEEK IMMEDIATE MEDICAL CARE IF:    There is confusion or drowsiness.   You can not awaken the injured person.   There is nausea (feeling sick to your stomach) or continued, forceful vomiting.   You notice dizziness or unsteadiness which is getting worse, or inability to walk.   You have convulsions or unconsciousness.   You experience severe, persistent headaches not relieved by over-the-counter or prescription medicines for pain. (Do not take aspirin as this impairs clotting abilities). Take other pain medications only as directed.   You can not use arms or legs normally.   There is clear or bloody discharge from the nose or ears.  MAKE SURE YOU:    Understand these instructions.   Will watch your condition.   Will get help right away if you are not doing well or get worse.  Document Released: 12/16/2005 Document Revised: 12/05/2011 Document  Reviewed: 11/03/2009  [facility] Patient Information 2012 [place].

## 2012-04-29 NOTE — ED Notes (Signed)
To xray and back    Linde Gillis, RN  04/29/12 [phone]

## 2012-05-27 LAB — MUMPS ANTIBODY, IGM: Mumps IgM: 0.39 IV (ref <=0.79)

## 2012-05-27 LAB — VARICELLA ZOSTER ANTIBODY, IGM: Varicella-Zoster Ab, IgM: 0.19 {ISR} (ref <=0.90)

## 2012-05-28 LAB — VARICELLA ZOSTER ANTIBODY, IGG: Varicella Immune Status: IMMUNE

## 2012-05-28 LAB — RUBELLA IMMUNE: Rubella Antibody IgG: IMMUNE

## 2012-05-28 LAB — MUMPS ANTIBODY, IGG: Mumps IgG: IMMUNE

## 2012-06-03 NOTE — Progress Notes (Signed)
Diabetes Self-Management Education Record    Participant Name: Christine Lynn  Referring Provider: Stoney Bang, MD  Assessment/Evaluation Ratings:  1=Needs Instruction   4=Demonstrates Understanding/Competency  2=Needs Review   NC=Not Covered    3=Comprehends Key Points  N/A=Not Applicable  Topics/Learning Objectives Pre-session Assess Date:  06/02/12 BDK Instr. Date Reinforce Date Post- session Eval Comments   Diabetes disease process & Treatment process: Define diabetes & pre-diabetes; Identify own type of diabetes; role of the pancreas; signs/symptoms; diagnostic criteria; prevention & treatment options; contributing factors. 2 06/02/12  BDK  3 Type 2   ND   Incorporating nutritional management into lifestyle: Describe effect of type, amount & timing of food on blood glucose; Describe basic meal planning techniques & current nutrition guidelines;Correctly read food labels & demonstrate CHO counting & portion control with personalized meal plan. Identify dining out strategies, & dietary sick day guidelines. 1 06/03/2012  [facility]  4 Pt attended Session 2.   Read labels correctly. Followed along and took notes.Participated in discussions.   Incorporating physical activity into lifestyle:   Verbalize effect of exercise on blood glucose levels; benefits of regular exercise; safety considerations; contraindications; maintenance of activity. 1 06/02/12  BDK  3    Using medications safely:  Identify effects of diabetes medicines on blood glucose levels; List diabetes medication taken, action & side effects; appropriate injection sites; proper storage; supplies needed; proper technique; safe needle disposal guidelines. 2 06/02/12  BDK  3 metformin   Monitoring blood glucose, interpreting and using results:  Identify recommended & personal blood glucose targets; importance of testing; testing supplies; HgbA1C target levels; Factors affecting blood glucose; Importance of logging blood glucose levels for pattern  recognition; ketone testing; safe lancet disposal. N/A 06/02/12  BDK   No meter   Prevention, detection & treatment of acute complications:  Identify symptoms of hyper & hypoglycemia, and prevention & treatment strategies. Describe sick day guidelines & indications for ketone testing & physician notification. Identify short term consequences of poor control. 1 06/02/12  BDK  3    Prevention, detection & treatment of chronic complications:  Define the natural course of diabetes & describe the relationship of blood glucose levels to long term complications of diabetes. Identify preventative measures & standards of care. 1 06/02/12  BDK  3    Developing strategies to address psychosocial issues:  Describe feelings about living with diabetes; Describe how stress, depression & anxiety affect blood glucose; Identify coping strategies; Identify support needed & support network available. 1 06/02/12  BDK  3 Hands Depression Tool Score= 27  [] Physician notified of suicidal ideations   Developing strategies to promote health/change behavior: Identify 7 self-care behaviors; Personal health risk factors; Benefits, challenges & strategies for behavioral change; Individualized goal selection. 1 06/02/12  BDK  3 Goal: Being active     Identified Barriers to learning/adherence to self management plan:     [] None  [] Physical  [] Visual  [] Hearing  [] Speech  [] Emotional  [] Cognitive    [] Reading  [] Language  [] Accessibility  [x] Financial    Instruction Method: [x] Lecture/Discussion  [x] PowerPoint Presentation  [x] Handouts   [] Return Demonstration  Education Materials/Equipment Provided:  [x] Self-Management Manual  [] Meal Plan [] Glucose Meter  [] Insulin Kit  [x] Nutritional Packet  [] Survival Packet   [] Other  []  Gestational Pathway Initiated & Signed    Encounter Type Date Start Time End Time Comments No Show Dates   Assessment 06/02/12 1800 1830   [x] In Person  [] Telephone    Session 1 06/02/12 1830  2100      Session 2 06/03/2012 1800 2030      Session 3     1800 calories/ 3-45 g meals/ 15 g AM snack/30 g PM/HS snack.    Individual MNT         Gestational MNT         Shared Med Appt         Yearly Follow-up        Meter Instrx        Insulin Instrx      [] Pen  [] Vial & Syringe      Additional Comments: Christine Lynn attended session 1 alone.  Very attentive to class materials.    DSMS Support Plan:  Follow-up plan/Date: 10/13  Contact Post Class Regarding:   Fasting Blood Sugar   HgbA1C   Weight   Hypertension/Follow-up with Physician   Self-Foot Exam Frequency   Monitoring Frequency   Exercise Routine   Goal Attainment  Post Education Referrals:       [] WIC   [] PAP   [] Wound Care   [] Social Service   [] Home Health  [] Support Group   [] Physician Specialist   [] Rehabilitation   [] Other

## 2012-06-03 NOTE — Progress Notes (Signed)
Diabetes Self-Management Education Record    Participant Name: Christine Lynn  Referring Provider: Stoney Bang, MD  Assessment/Evaluation Ratings:  1=Needs Instruction   4=Demonstrates Understanding/Competency  2=Needs Review   NC=Not Covered    3=Comprehends Key Points  N/A=Not Applicable  Topics/Learning Objectives Pre-session Assess Date:  06/02/12 BDK Instr. Date Reinforce Date Post- session Eval Comments   Diabetes disease process & Treatment process: Define diabetes & pre-diabetes; Identify own type of diabetes; role of the pancreas; signs/symptoms; diagnostic criteria; prevention & treatment options; contributing factors. 2 06/02/12  BDK  3 Type 2   ND   Incorporating nutritional management into lifestyle: Describe effect of type, amount & timing of food on blood glucose; Describe basic meal planning techniques & current nutrition guidelines;Correctly read food labels & demonstrate CHO counting & portion control with personalized meal plan. Identify dining out strategies, & dietary sick day guidelines. 1       Incorporating physical activity into lifestyle:   Verbalize effect of exercise on blood glucose levels; benefits of regular exercise; safety considerations; contraindications; maintenance of activity. 1 06/02/12  BDK  3    Using medications safely:  Identify effects of diabetes medicines on blood glucose levels; List diabetes medication taken, action & side effects; appropriate injection sites; proper storage; supplies needed; proper technique; safe needle disposal guidelines. 2 06/02/12  BDK  3 metformin   Monitoring blood glucose, interpreting and using results:  Identify recommended & personal blood glucose targets; importance of testing; testing supplies; HgbA1C target levels; Factors affecting blood glucose; Importance of logging blood glucose levels for pattern recognition; ketone testing; safe lancet disposal. N/A 06/02/12  BDK   No meter   Prevention, detection & treatment of acute  complications:  Identify symptoms of hyper & hypoglycemia, and prevention & treatment strategies. Describe sick day guidelines & indications for ketone testing & physician notification. Identify short term consequences of poor control. 1 06/02/12  BDK  3    Prevention, detection & treatment of chronic complications:  Define the natural course of diabetes & describe the relationship of blood glucose levels to long term complications of diabetes. Identify preventative measures & standards of care. 1 06/02/12  BDK  3    Developing strategies to address psychosocial issues:  Describe feelings about living with diabetes; Describe how stress, depression & anxiety affect blood glucose; Identify coping strategies; Identify support needed & support network available. 1 06/02/12  BDK  3 Hands Depression Tool Score= 27  [] Physician notified of suicidal ideations   Developing strategies to promote health/change behavior: Identify 7 self-care behaviors; Personal health risk factors; Benefits, challenges & strategies for behavioral change; Individualized goal selection. 1 06/02/12  BDK  3 Goal: Being active     Identified Barriers to learning/adherence to self management plan:     [] None  [] Physical  [] Visual  [] Hearing  [] Speech  [] Emotional  [] Cognitive    [] Reading  [] Language  [] Accessibility  [x] Financial    Instruction Method: [x] Lecture/Discussion  [x] PowerPoint Presentation  [x] Handouts   [] Return Demonstration  Education Materials/Equipment Provided:  [x] Self-Management Manual  [] Meal Plan [] Glucose Meter  [] Insulin Kit  [] Nutritional Packet  [] Survival Packet   [] Other  []  Gestational Pathway Initiated & Signed    Encounter Type Date Start Time End Time Comments No Show Dates   Assessment 06/02/12 1800 1830   [x] In Person  [] Telephone    Session 1 06/02/12 1830 2100      Session 2        Session 3  Individual MNT         Gestational MNT         Shared Med Appt         Yearly Follow-up        Meter Instrx        Insulin  Instrx      [] Pen  [] Vial & Syringe      Additional Comments: [name] attended session 1 alone.  Very attentive to class materials.    DSMS Support Plan:  Follow-up plan/Date: 10/13  Contact Post Class Regarding:   Fasting Blood Sugar   HgbA1C   Weight   Hypertension/Follow-up with Physician   Self-Foot Exam Frequency   Monitoring Frequency   Exercise Routine   Goal Attainment  Post Education Referrals:       [] WIC   [] PAP   [] Wound Care   [] Social Service   [] Home Health  [] Support Group   [] Physician Specialist   [] Rehabilitation   [] Other

## 2012-06-03 NOTE — Progress Notes (Signed)
Diabetes Self-Management Education Record    Participant Name: Christine Lynn  Referring Provider: Stoney Bang, MD  Assessment/Evaluation Ratings:  1=Needs Instruction   4=Demonstrates Understanding/Competency  2=Needs Review   NC=Not Covered    3=Comprehends Key Points  N/A=Not Applicable  Topics/Learning Objectives Pre-session Assess Date:  06/02/12 BDK Instr. Date Reinforce Date Post- session Eval Comments   Diabetes disease process & Treatment process: Define diabetes & pre-diabetes; Identify own type of diabetes; role of the pancreas; signs/symptoms; diagnostic criteria; prevention & treatment options; contributing factors. 2 06/02/12  BDK  3 Type 2   ND   Incorporating nutritional management into lifestyle: Describe effect of type, amount & timing of food on blood glucose; Describe basic meal planning techniques & current nutrition guidelines;Correctly read food labels & demonstrate CHO counting & portion control with personalized meal plan. Identify dining out strategies, & dietary sick day guidelines. 1       Incorporating physical activity into lifestyle:   Verbalize effect of exercise on blood glucose levels; benefits of regular exercise; safety considerations; contraindications; maintenance of activity. 1 06/02/12  BDK  3    Using medications safely:  Identify effects of diabetes medicines on blood glucose levels; List diabetes medication taken, action & side effects; appropriate injection sites; proper storage; supplies needed; proper technique; safe needle disposal guidelines. 2 06/02/12  BDK  3 metformin   Monitoring blood glucose, interpreting and using results:  Identify recommended & personal blood glucose targets; importance of testing; testing supplies; HgbA1C target levels; Factors affecting blood glucose; Importance of logging blood glucose levels for pattern recognition; ketone testing; safe lancet disposal. N/A 06/02/12  BDK   No meter   Prevention, detection & treatment of acute  complications:  Identify symptoms of hyper & hypoglycemia, and prevention & treatment strategies. Describe sick day guidelines & indications for ketone testing & physician notification. Identify short term consequences of poor control. 1 06/02/12  BDK  3    Prevention, detection & treatment of chronic complications:  Define the natural course of diabetes & describe the relationship of blood glucose levels to long term complications of diabetes. Identify preventative measures & standards of care. 1 06/02/12  BDK  3    Developing strategies to address psychosocial issues:  Describe feelings about living with diabetes; Describe how stress, depression & anxiety affect blood glucose; Identify coping strategies; Identify support needed & support network available. 1 06/02/12  BDK  3 Hands Depression Tool Score= 27  [] Physician notified of suicidal ideations   Developing strategies to promote health/change behavior: Identify 7 self-care behaviors; Personal health risk factors; Benefits, challenges & strategies for behavioral change; Individualized goal selection. 1 06/02/12  BDK  3 Goal:     Identified Barriers to learning/adherence to self management plan:     [] None  [] Physical  [] Visual  [] Hearing  [] Speech  [] Emotional  [] Cognitive    [] Reading  [] Language  [] Accessibility  [x] Financial    Instruction Method: [x] Lecture/Discussion  [x] PowerPoint Presentation  [x] Handouts   [] Return Demonstration  Education Materials/Equipment Provided:  [x] Self-Management Manual  [] Meal Plan [] Glucose Meter  [] Insulin Kit  [] Nutritional Packet  [] Survival Packet   [] Other  []  Gestational Pathway Initiated & Signed    Encounter Type Date Start Time End Time Comments No Show Dates   Assessment 06/02/12 1800 1830   [x] In Person  [] Telephone    Session 1 06/02/12 1830 2100      Session 2        Session 3  Individual MNT         Gestational MNT         Shared Med Appt         Yearly Follow-up        Meter Instrx        Insulin Instrx      [] Pen   [] Vial & Syringe      Additional Comments: Christine Lynn attended session 1 alone.  Very attentive to class materials.    DSMS Support Plan:  Follow-up plan/Date: 10/13  Contact Post Class Regarding:   Fasting Blood Sugar   HgbA1C   Weight   Hypertension/Follow-up with Physician   Self-Foot Exam Frequency   Monitoring Frequency   Exercise Routine   Goal Attainment  Post Education Referrals:       [] WIC   [] PAP   [] Wound Care   [] Social Service   [] Home Health  [] Support Group   [] Physician Specialist   [] Rehabilitation   [] Other

## 2012-06-04 NOTE — Progress Notes (Signed)
Diabetes Self-Management Education Record    Participant Name: Christine Lynn  Referring Provider: Stoney Bang, MD  Assessment/Evaluation Ratings:  1=Needs Instruction   4=Demonstrates Understanding/Competency  2=Needs Review   NC=Not Covered    3=Comprehends Key Points  N/A=Not Applicable  Topics/Learning Objectives Pre-session Assess Date:  06/02/12 BDK Instr. Date Reinforce Date Post- session Eval Comments   Diabetes disease process & Treatment process: Define diabetes & pre-diabetes; Identify own type of diabetes; role of the pancreas; signs/symptoms; diagnostic criteria; prevention & treatment options; contributing factors. 2 06/02/12  BDK  3 Type 2   ND   Incorporating nutritional management into lifestyle: Describe effect of type, amount & timing of food on blood glucose; Describe basic meal planning techniques & current nutrition guidelines;Correctly read food labels & demonstrate CHO counting & portion control with personalized meal plan. Identify dining out strategies, & dietary sick day guidelines. 1 06/03/2012  [facility] 06/04/2012  [facility] 4 Pt attended Session 2.   Read labels correctly. Followed along and took notes.Participated in discussions.    Instruction on1800 calories/ 3-45 g meals/ 15 g AM snack/30 g PM/HS snack provided. Pt was able to plan  meals correctly using food models and cheat sheet.  See media, pt education for copy of meal plan/carb.     Incorporating physical activity into lifestyle:   Verbalize effect of exercise on blood glucose levels; benefits of regular exercise; safety considerations; contraindications; maintenance of activity. 1 06/02/12  BDK  3    Using medications safely:  Identify effects of diabetes medicines on blood glucose levels; List diabetes medication taken, action & side effects; appropriate injection sites; proper storage; supplies needed; proper technique; safe needle disposal guidelines. 2 06/02/12  BDK  3 metformin   Monitoring blood glucose, interpreting and  using results:  Identify recommended & personal blood glucose targets; importance of testing; testing supplies; HgbA1C target levels; Factors affecting blood glucose; Importance of logging blood glucose levels for pattern recognition; ketone testing; safe lancet disposal. N/A 06/02/12  BDK   No meter   Prevention, detection & treatment of acute complications:  Identify symptoms of hyper & hypoglycemia, and prevention & treatment strategies. Describe sick day guidelines & indications for ketone testing & physician notification. Identify short term consequences of poor control. 1 06/02/12  BDK  3    Prevention, detection & treatment of chronic complications:  Define the natural course of diabetes & describe the relationship of blood glucose levels to long term complications of diabetes. Identify preventative measures & standards of care. 1 06/02/12  BDK  3    Developing strategies to address psychosocial issues:  Describe feelings about living with diabetes; Describe how stress, depression & anxiety affect blood glucose; Identify coping strategies; Identify support needed & support network available. 1 06/02/12  BDK  3 Hands Depression Tool Score= 27  [] Physician notified of suicidal ideations   Developing strategies to promote health/change behavior: Identify 7 self-care behaviors; Personal health risk factors; Benefits, challenges & strategies for behavioral change; Individualized goal selection. 1 06/02/12  BDK 06/04/2012  [facility] 3 Goal: Being active     Identified Barriers to learning/adherence to self management plan:     [] None  [] Physical  [] Visual  [] Hearing  [] Speech  [] Emotional  [] Cognitive    [] Reading  [] Language  [] Accessibility  [x] Financial    Instruction Method: [x] Lecture/Discussion  [x] PowerPoint Presentation  [x] Handouts   [] Return Demonstration  Education Materials/Equipment Provided:  [x] Self-Management Manual  [x] Meal Plan [] Glucose Meter  [] Insulin Kit  [x] Nutritional  Packet  [] Survival Packet   [] Other  []   Gestational Pathway Initiated & Signed    Encounter Type Date Start Time End Time Comments No Show Dates   Assessment 06/02/12 1800 1830   [x] In Person  [] Telephone    Session 1 06/02/12 1830 2100      Session 2 06/03/2012 1800 2030     Session 3 06/04/2012 1800 2030      Individual MNT         Gestational MNT         Shared Med Appt         Yearly Follow-up        Meter Instrx        Insulin Instrx      [] Pen  [] Vial & Syringe      Additional Comments: Jakia attended session 1 alone.  Very attentive to class materials.    DSMS Support Plan:  Follow-up plan/Date: 10/13  Contact Post Class Regarding:   Fasting Blood Sugar   HgbA1C   Weight   Hypertension/Follow-up with Physician   Self-Foot Exam Frequency   Monitoring Frequency   Exercise Routine   Goal Attainment  Post Education Referrals:       [] WIC   [] PAP   [] Wound Care   [] Social Service   [] Home Health  [] Support Group   [] Physician Specialist   [] Rehabilitation   [] Other

## 2012-10-08 LAB — T4, FREE: T4 Free: 1.59 ng/dL (ref 0.89–1.76)

## 2012-10-08 LAB — T3, UPTAKE: T3 Uptake: 36.2 % (ref 22.5–37.0)

## 2012-10-08 LAB — HEMOGLOBIN A1C: Hemoglobin A1C: 6 % (ref 4.8–6.0)

## 2012-10-08 LAB — T4: T4, Total: 13.2 ug/dL — ABNORMAL HIGH (ref 4.5–10.9)

## 2012-12-30 NOTE — L&D Delivery Note (Signed)
32 y/o african [place] female G1 with EDD 10/15/13, admitted for induction of labor at 39 3/[redacted] weeks GA for gestational DM, hypertension and hypothyroidism complicating pregnancy. Labor induced with IV Pitocin. Delivered at 12.23 am via spontaneous vaginal delivery of a live baby girl weighing 6 lb 14 oz, with Apgar scores of 9 and 9 after 1 and 5 minutes respectively. Baby bulb suctioned over the perineum. Nuchal cord x 1 noted.  Placenta and membranes delivered by cord traction, complete with a 3 vessels cord.  Vagina and perineum intact.  Estimated blood loss: 200 ml.

## 2013-02-13 LAB — CBC WITH AUTO DIFFERENTIAL
Absolute Eos #: 0.05 E9/L (ref 0.05–0.50)
Absolute Lymph #: 2.07 E9/L (ref 1.50–4.00)
Absolute Mono #: 0.39 E9/L (ref 0.10–0.95)
Absolute Neut #: 3.72 E9/L (ref 1.80–7.30)
Basophils Absolute: 0.03 E9/L (ref 0.00–0.20)
Basophils: 1 % (ref 0–2)
Eosinophils %: 1 % (ref 0–6)
Hematocrit: 34.5 % (ref 34.0–48.0)
Hemoglobin: 11.4 g/dL — ABNORMAL LOW (ref 11.5–15.5)
Lymphocytes: 33 % (ref 20–42)
MCH: 28.3 pg (ref 26.0–35.0)
MCHC: 33.1 % (ref 32.0–34.5)
MCV: 85.6 fL (ref 80.0–99.9)
MPV: 9.3 fL (ref 7.0–12.0)
Monocytes: 6 % (ref 2–12)
Platelets: 314 E9/L (ref 130–450)
RBC: 4.04 E12/L (ref 3.50–5.50)
RDW: 14 fL (ref 11.5–15.0)
Seg Neutrophils: 60 % (ref 43–80)
WBC: 6.3 E9/L (ref 4.5–11.5)

## 2013-02-13 LAB — GFR CALCULATED: Gfr Calculated: >60 mL/min/{1.73_m2} (ref >=60)

## 2013-02-13 LAB — COMPREHENSIVE METABOLIC PANEL
ALT: 10 U/L (ref 0–32)
AST: 11 U/L (ref 0–31)
Albumin: 3.7 g/dL (ref 3.5–5.2)
Alkaline Phosphatase: 43 U/L (ref 35–104)
BUN: 10 mg/dL (ref 6–20)
CO2: 25 mmol/L (ref 22–29)
Calcium: 9.1 mg/dL (ref 8.6–10.2)
Chloride: 100 mmol/L (ref 98–107)
Creatinine: 0.8 mg/dL (ref 0.5–1.0)
Glucose: 98 mg/dL (ref 74–109)
Potassium: 4.1 mmol/L (ref 3.5–5.0)
Sodium: 135 mmol/L (ref 132–146)
Total Bilirubin: 0.2 mg/dL (ref 0.0–1.2)
Total Protein: 7.2 g/dL (ref 6.4–8.3)

## 2013-02-13 LAB — T4, FREE: T4 Free: 1 ng/dL (ref 0.93–1.70)

## 2013-02-13 LAB — HEMOGLOBIN A1C: Hemoglobin A1C: 6 % — ABNORMAL HIGH (ref 4.8–5.9)

## 2013-02-13 LAB — TSH: TSH: 1.09 u[IU]/mL (ref 0.270–4.200)

## 2013-02-13 LAB — LIPID PANEL
Cholesterol: 212 mg/dL — ABNORMAL HIGH (ref 0–199)
HDL: 61 mg/dL (ref >40)
LDL Calculated: 135 mg/dL — ABNORMAL HIGH (ref 0–99)
Triglycerides: 80 mg/dL (ref 0–149)

## 2013-03-01 NOTE — ED Notes (Signed)
Multiple attempts to obtain blood work unsuccessful,dr clore notified     Kerrin MoVanessa M Janete Quilling, RN  03/01/13 2354

## 2013-03-01 NOTE — ED Provider Notes (Signed)
HPI Comments: Patient comes in with complaint of vaginal bleeding that started at 9:30 this evening. She also is now complaining of lower abdominal cramping. She is approximately 7 weeks and 3 days pregnant. He did have a transvaginal ultrasound today which confirmed this. He states initially the bleeding was heavy but since then has slowed down considerably. She denies any lightheadedness or dizziness.  She states this is her second pregnancy. Her last pregnancy had a miscarriage at 4 months.  Currently rates her pain 6/10 and states it is cramping in nature. She denies any urinary symptoms.  She is not taking anything for pain since onset. Pain is intermittent in nature.    Patient is a 32 y.o. female presenting with female genitourinary complaint. The history is provided by the patient.   Female GU Problem  Associated symptoms: abdominal pain    Associated symptoms: no flank pain, no nausea, no vaginal discharge and no vomiting        Review of Systems   Constitutional: Negative for fatigue.   Eyes: Negative for visual disturbance.   Gastrointestinal: Positive for abdominal pain. Negative for nausea, vomiting, diarrhea and constipation.   Genitourinary: Positive for vaginal bleeding. Negative for dysuria, frequency, flank pain, decreased urine volume, vaginal discharge and difficulty urinating.   Musculoskeletal: Negative for back pain.   Skin: Negative for pallor.   Neurological: Negative for dizziness and light-headedness.   Hematological: Does not bruise/bleed easily.   All other systems reviewed and are negative.        Physical Exam   Nursing note and vitals reviewed.  Constitutional: She is oriented to person, place, and time. She appears well-developed and well-nourished. No distress.   HENT:   Head: Normocephalic and atraumatic.   Eyes: Conjunctivae are normal. Pupils are equal, round, and reactive to light.   No conjunctival pallor   Neck: Normal range of motion. Neck supple.   Cardiovascular: Normal  rate, regular rhythm and normal heart sounds.    No murmur heard.  Pulmonary/Chest: Effort normal and breath sounds normal. No respiratory distress.   Abdominal: Soft. Bowel sounds are normal. There is tenderness (mild lower abdominal tenderness to palpation). There is no rebound and no guarding.   Genitourinary:   Cervical os closed. Mild amount of vaginal bleeding. No vaginal discharge.   Musculoskeletal: She exhibits no edema.   Neurological: She is alert and oriented to person, place, and time.   Skin: Skin is warm and dry. She is not diaphoretic.       Procedures    MDM        --------------------------------------------- PAST HISTORY ---------------------------------------------  Past Medical History:  has a past medical history of Hypertension and Anxiety.    Past Surgical History:  has no past surgical history on file.    Social History:  reports that she has never smoked. She has never used smokeless tobacco. She reports that she does not drink alcohol or use illicit drugs.    Family History: family history includes Cancer in her maternal grandfather, maternal grandmother, and another family member and Diabetes in her mother and other family members.     The patient???s home medications have been reviewed.    Allergies: Review of patient's allergies indicates no known allergies.    -------------------------------------------------- RESULTS -------------------------------------------------  Labs:  Results for orders placed during the hospital encounter of 03/01/13   CBC WITH AUTO DIFFERENTIAL       Result Value Range    WBC 8.2  4.5 -  11.5 E9/L    RBC 3.86  3.50 - 5.50 E12/L    Hemoglobin 11.1 (*) 11.5 - 15.5 g/dL    Hematocrit 16.1 (*) 34.0 - 48.0 %    MCV 85.0  80.0 - 99.9 fL    MCH 28.8  26.0 - 35.0 pg    MCHC 33.9  32.0 - 34.5 %    RDW 14.4  11.5 - 15.0 fL    Platelets 244  130 - 450 E9/L    MPV 8.7  7.0 - 12.0 fL   URINALYSIS WITH MICROSCOPIC       Result Value Range    Color, UA RED  YELLOW    Clarity,  UA CLOUDY  CLEAR    Glucose, Ur NEGATIVE  NEGATIVE mg/dL    Bilirubin Urine NEGATIVE  NEGATIVE    Ketones, Urine TRACE (*) NEGATIVE mg/dL    Specific Gravity, UA >=1.030  <=1.035    Blood, Urine LARGE (*) NEGATIVE    pH, UA 5.5  5.0 - 8.5    Protein, UA 100 (*) NEGATIVE mg/dL    Urobilinogen, Urine 1.0  0.2 - 1.0 E.U./dL    Nitrite, Urine POSITIVE (*) NEGATIVE    Leukocyte Esterase, Urine TRACE (*) NEGATIVE    WBC, UA 2-5  <5 /hpf    RBC, UA >20 (*) <2 /hpf    Bacteria, UA MODERATE (*) NONE /hpf    Amorphous, UA RARE      Squam Epithel, UA RARE     HCG, QUANTITATIVE, PREGNANCY       Result Value Range    HCG, Total 56736.0 (*) 0.0 - 7.0 mIU/mL   ABO RH TYPING OB       Result Value Range    RH/DU DONE     ABO/RH       Result Value Range    ABO/Rh B POS         Radiology:  US Ob Transvaginal    03/02/2013  Reading location: 100   History: Vaginal bleeding and pregnancy.   Transvaginal ultrasound     FINDINGS:  Single live intrauterine pregnancy is identified.  Fetal cardiac activity is present.  Measurements are equal to 7 week 4 day. Yolk sac is present. No evidence of subchorionic hemorrhage. 2.9 cm cyst the right ovary. Left ovary is normal. Cul-de-sac is normal.       03/02/2013  IMPRESSION: Single live intrauterine pregnancy at 7 week 4 day.       ------------------------- NURSING NOTES AND VITALS REVIEWED ---------------------------  The nursing notes within the ED encounter and vital signs as below have been reviewed.   BP 137/95   Pulse 90   Temp(Src) 98.5 ??F (36.9 ??C) (Oral)   Resp 16   Ht 5\' 7"  (1.702 m)   Wt 173 lb (78.472 kg)   BMI 27.09 kg/m2   SpO2 100%   LMP 04/15/2012   Breastfeeding? No  Oxygen Saturation Interpretation: Normal      ------------------------------------------ PROGRESS NOTES ------------------------------------------  I have spoken with the patient and discussed today???s results, in addition to providing specific details for the plan of care and counseling regarding the diagnosis and  prognosis.  Their questions are answered at this time and they are agreeable with the plan.      --------------------------------- ADDITIONAL PROVIDER NOTES ---------------------------------  At this time the patient is without objective evidence of an acute process requiring hospitalization or inpatient management.  They have remained hemodynamically stable throughout their entire ED visit  and are stable for discharge with outpatient follow-up.     The plan has been discussed in detail and they are aware of the specific conditions for emergent return, as well as the importance of follow-up.      New Prescriptions    AMOXICILLIN (AMOXIL) 500 MG CAPSULE    Take 1 capsule by mouth 3 times daily for 10 days.       Diagnosis:  1. Urinary tract infection, site not specified    2. Vaginal abnormality in pregnancy        Disposition:  Patient's disposition: Discharge to home  Patient's condition is stable.          Nils Pyle, DO  03/02/13 0139

## 2013-03-02 ENCOUNTER — Inpatient Hospital Stay
Admit: 2013-03-02 | Discharge: 2013-03-02 | Disposition: A | Discharge status: Home / Self Care | Attending: Emergency Medicine

## 2013-03-02 LAB — URINALYSIS WITH MICROSCOPIC
Bilirubin Urine: NEGATIVE
Glucose, Ur: NEGATIVE mg/dL
Nitrite, Urine: POSITIVE — AB
Protein, UA: 100 mg/dL — AB
RBC, UA: >20 /hpf — AB (ref <2)
Specific Gravity, UA: >=1.03 (ref <=1.035)
Urobilinogen, Urine: 1 E.U./dL (ref 0.2–1.0)
pH, UA: 5.5 (ref 5.0–8.5)

## 2013-03-02 LAB — CBC WITH AUTO DIFFERENTIAL
Absolute Eos #: 0.16 E9/L (ref 0.05–0.50)
Absolute Lymph #: 2.38 E9/L (ref 1.50–4.00)
Absolute Mono #: 0.57 E9/L (ref 0.10–0.95)
Absolute Neut #: 5.08 E9/L (ref 1.80–7.30)
Basophils Absolute: 0 E9/L (ref 0.00–0.20)
Basophils: 0 % (ref 0–2)
Eosinophils %: 2 % (ref 0–6)
Hematocrit: 32.8 % — ABNORMAL LOW (ref 34.0–48.0)
Hemoglobin: 11.1 g/dL — ABNORMAL LOW (ref 11.5–15.5)
Lymphocytes: 29 % (ref 20–42)
MCH: 28.8 pg (ref 26.0–35.0)
MCHC: 33.9 % (ref 32.0–34.5)
MCV: 85 fL (ref 80.0–99.9)
MPV: 8.7 fL (ref 7.0–12.0)
Monocytes: 7 % (ref 2–12)
Platelet Estimate: NORMAL
Platelets: 244 E9/L (ref 130–450)
RBC: 3.86 E12/L (ref 3.50–5.50)
RDW: 14.4 fL (ref 11.5–15.0)
Seg Neutrophils: 62 % (ref 43–80)
WBC: 8.2 E9/L (ref 4.5–11.5)

## 2013-03-02 LAB — ABO RH TYPING OB

## 2013-03-02 LAB — ABO/RH: ABO/Rh: B POS

## 2013-03-02 LAB — HCG, QUANTITATIVE, PREGNANCY: HCG, Total: 56736 m[IU]/mL — ABNORMAL HIGH (ref 0.0–7.0)

## 2013-03-02 MED ORDER — AMOXICILLIN 500 MG PO CAPS
500 MG | ORAL_CAPSULE | Freq: Three times a day (TID) | ORAL | Status: AC
Start: 2013-03-02 — End: 2013-03-12

## 2013-03-02 MED FILL — NORMAL SALINE FLUSH 0.9 % IV SOLN: 0.9 % | INTRAVENOUS | Qty: 10

## 2013-03-02 NOTE — ED Notes (Signed)
Urine sent to lab       Kerrin MoVanessa M Antjuan Rothe, RN  03/02/13 0005

## 2013-03-02 NOTE — ED Notes (Signed)
Lab work sent to lab, pt to ultrasound via cart     Kerrin MoVanessa M Avah Bashor, RN  03/02/13 [phone]

## 2013-03-02 NOTE — Discharge Instructions (Signed)
Urinary Tract Infection  Urinary tract infections (UTIs) can develop anywhere along your urinary tract. Your urinary tract is your body's drainage system for removing wastes and extra water. Your urinary tract includes two kidneys, two ureters, a bladder, and a urethra. Your kidneys are a pair of bean-shaped organs. Each kidney is about the size of your fist. They are located below your ribs, one on each side of your spine.  CAUSES  Infections are caused by microbes, which are microscopic organisms, including fungi, viruses, and bacteria. These organisms are so small that they can only be seen through a microscope. Bacteria are the microbes that most commonly cause UTIs.  SYMPTOMS   Symptoms of UTIs may vary by age and gender of the patient and by the location of the infection. Symptoms in young women typically include a frequent and intense urge to urinate and a painful, burning feeling in the bladder or urethra during urination. Older women and men are more likely to be tired, shaky, and weak and have muscle aches and abdominal pain. A fever may mean the infection is in your kidneys. Other symptoms of a kidney infection include pain in your back or sides below the ribs, nausea, and vomiting.  DIAGNOSIS  To diagnose a UTI, your caregiver will ask you about your symptoms. Your caregiver also will ask to provide a urine sample. The urine sample will be tested for bacteria and white blood cells. White blood cells are made by your body to help fight infection.  TREATMENT   Typically, UTIs can be treated with medication. Because most UTIs are caused by a bacterial infection, they usually can be treated with the use of antibiotics. The choice of antibiotic and length of treatment depend on your symptoms and the type of bacteria causing your infection.  HOME CARE INSTRUCTIONS   If you were prescribed antibiotics, take them exactly as your caregiver instructs you. Finish the medication even if you feel better after you  have only taken some of the medication.   Drink enough water and fluids to keep your urine clear or pale yellow.   Avoid caffeine, tea, and carbonated beverages. They tend to irritate your bladder.   Empty your bladder often. Avoid holding urine for long periods of time.   Empty your bladder before and after sexual intercourse.   After a bowel movement, women should cleanse from front to back. Use each tissue only once.  SEEK MEDICAL CARE IF:    You have back pain.   You develop a fever.   Your symptoms do not begin to resolve within 3 days.  SEEK IMMEDIATE MEDICAL CARE IF:    You have severe back pain or lower abdominal pain.   You develop chills.   You have nausea or vomiting.   You have continued burning or discomfort with urination.  MAKE SURE YOU:    Understand these instructions.   Will watch your condition.   Will get help right away if you are not doing well or get worse.  Document Released: 09/25/2005 Document Revised: 06/16/2012 Document Reviewed: 01/24/2012  [facility] Patient Information 2013 [place].      Vaginal Bleeding During Pregnancy, First Trimester  A small amount of bleeding (spotting) is relatively common in early pregnancy. It usually stops on its own. There are many causes for bleeding or spotting in early pregnancy. Some bleeding may be related to the pregnancy and some may not. Cramping with the bleeding is more serious and concerning. Tell your caregiver  if you have any vaginal bleeding.   CAUSES    It is normal in most cases.   The pregnancy ends (miscarriage).   The pregnancy may end (threatened miscarriage).   Infection or inflammation of the cervix.   Growths (polyps) on the cervix.   Pregnancy happens outside of the uterus and in a fallopian tube (tubal pregnancy).   Many tiny cysts in the uterus instead of pregnancy tissue (molar pregnancy).  SYMPTOMS   Vaginal bleeding or spotting with or without cramps.  DIAGNOSIS   To evaluate the pregnancy, your  caregiver may:   Do a pelvic exam.   Take blood tests.   Do an ultrasound.  It is very important to follow your caregiver's instructions.   TREATMENT    Evaluation of the pregnancy with blood tests and ultrasound.   Bed rest (getting up to use the bathroom only).   Rho-gam immunization if the mother is Rh negative and the father is Rh positive.  HOME CARE INSTRUCTIONS    If your caregiver orders bed rest, you may need to make arrangements for the care of other children and for other responsibilities. However, your caregiver may allow you to continue light activity.   Keep track of the number of pads you use each day, how often you change pads and how soaked (saturated) they are. Write this down.   Do not use tampons. Do not douche.   Do not have sexual intercourse or orgasms until approved by your physician.   Save any tissue that you pass for your caregiver to see.   Take medicine for cramps only with your caregiver's permission.   Do not take aspirin because it can make you bleed.  SEEK IMMEDIATE MEDICAL CARE IF:    You experience severe cramps in your stomach, back or belly (abdomen).   You have an oral temperature above 102 F (38.9 C), not controlled by medicine.   You pass large clots or tissue.   Your bleeding increases or you become light-headed, weak or have fainting episodes.   You develop chills.   You are leaking or have a gush of fluid from your vagina.   You pass out while having a bowel movement. That may mean you have a ruptured tubal pregnancy.  Document Released: 09/25/2005 Document Revised: 03/09/2012 Document Reviewed: 04/06/2009  [facility] Patient Information 2013 [place].

## 2013-03-10 NOTE — Progress Notes (Signed)
Diabetes Self-Management Education Record    Participant Name: Christine Lynn  Referring Provider: Channing Mutters BROOKS  Assessment/Evaluation Ratings:  1=Needs Instruction   4=Demonstrates Understanding/Competency  2=Needs Review   NC=Not Covered    3=Comprehends Key Points  N/A=Not Applicable  Topics/Learning Objectives Pre-session Assess Date:  06/02/12 BDK Instr. Date Reinforce Date Post- session Eval Comments   Diabetes disease process & Treatment process: Define diabetes & pre-diabetes; Identify own type of diabetes; role of the pancreas; signs/symptoms; diagnostic criteria; prevention & treatment options; contributing factors. 2 06/02/12  BDK 03/10/13  BDK 3 Type 2   ND    03/10/13 Type 2 plus pregnant   Incorporating nutritional management into lifestyle: Describe effect of type, amount & timing of food on blood glucose; Describe basic meal planning techniques & current nutrition guidelines;Correctly read food labels & demonstrate CHO counting & portion control with personalized meal plan. Identify dining out strategies, & dietary sick day guidelines. 1 06/03/2012  DRH 06/04/2012  DRH 4 Pt attended Session 2.   Read labels correctly. Followed along and took notes.Participated in discussions.    Instruction on1800 calories/ 3-45 g meals/ 15 g AM snack/30 g PM/HS snack provided. Pt was able to plan  meals correctly using food models and cheat sheet.  See media, pt education for copy of meal plan/carb.    02/1213 Instructed on about 1900 calories. 3 (45g) meals, 3 (25g) snacks. Able to plan dinner correctly and read nutrition facts for carbohydrate and fat. Followed along and asked questions. Since she is early in her pregnancy I recommended she call around 20 weeks to get her calories increased.    Incorporating physical activity into lifestyle:   Verbalize effect of exercise on blood glucose levels; benefits of regular exercise; safety considerations; contraindications; maintenance of activity. 1 06/02/12  BDK  03/10/13  BDK 3    Using medications safely:  Identify effects of diabetes medicines on blood glucose levels; List diabetes medication taken, action & side effects; appropriate injection sites; proper storage; supplies needed; proper technique; safe needle disposal guidelines. 2 06/02/12  BDK 03/10/13  BDK 3 metformin   Monitoring blood glucose, interpreting and using results:  Identify recommended & personal blood glucose targets; importance of testing; testing supplies; HgbA1C target levels; Factors affecting blood glucose; Importance of logging blood glucose levels for pattern recognition; ketone testing; safe lancet disposal. N/A 06/02/12  BDK 03/10/13  BDK 3 No meter    03/10/13 Freestyle Insulinx meter to test 4 times a day   Prevention, detection & treatment of acute complications:  Identify symptoms of hyper & hypoglycemia, and prevention & treatment strategies. Describe sick day guidelines & indications for ketone testing & physician notification. Identify short term consequences of poor control. 1 06/02/12  BDK 03/10/13  BDK 3    Prevention, detection & treatment of chronic complications:  Define the natural course of diabetes & describe the relationship of blood glucose levels to long term complications of diabetes. Identify preventative measures & standards of care. 1 06/02/12  BDK  3    Developing strategies to address psychosocial issues:  Describe feelings about living with diabetes; Describe how stress, depression & anxiety affect blood glucose; Identify coping strategies; Identify support needed & support network available. 1 06/02/12  BDK  3 Hands Depression Tool Score= 27  [] Physician notified of suicidal ideations   Developing strategies to promote health/change behavior: Identify 7 self-care behaviors; Personal health risk factors; Benefits, challenges & strategies for behavioral change; Individualized goal selection. 1  06/02/12  BDK 06/04/2012  DRH 3 Goal: Being active     Identified Barriers to  learning/adherence to self management plan:     [x] None  [] Physical  [] Visual  [] Hearing  [] Speech  [] Emotional  [] Cognitive    [] Reading  [] Language  [] Accessibility  [x] Financial    Instruction Method: [x] Lecture/Discussion  [x] PowerPoint Presentation  [x] Handouts   [x] Return Demonstration  Education Materials/Equipment Provided:  [x] Self-Management Manual  [x] Meal Plan [] Glucose Meter  [] Insulin Kit  [x] Nutritional Packet  [] Survival Packet   [] Other  [x]  Gestational Pathway Initiated & Signed    Encounter Type Date Start Time End Time Comments No Show Dates   Assessment 06/02/12 1800 1830   [x] In Person  [] Telephone    Session 1 06/02/12 1830 2100      Session 2 06/03/2012 1800 2030     Session 3 06/04/2012 1800 2030      Individual MNT         Gestational MNT 03/10/13 1430 1515      Shared Med Appt         Yearly Follow-up        Meter Instrx 03/10/13 1400 1430 Insulinx glucose meter    Insulin Instrx      [] Pen  [] Vial & Syringe      Additional Comments: Christine Lynn attended session 1 alone.  Very attentive to class materials.    03/10/13  Christine Lynn was seen for Type 2 and pregnant.  The gestational diabetes book information was reviewed with her.  She was instructed on the Freestyle Insulinx glucose meter.  Glucose was 88 approx 3 hours after lunch.  Gestational pathway was reviewed.  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      DSMS Support Plan:  Follow-up plan/Date: 10/13  Contact Post Class Regarding:   Fasting Blood Sugar   HgbA1C   Weight   Hypertension/Follow-up with Physician   Self-Foot Exam Frequency   Monitoring Frequency   Exercise Routine   Goal Attainment  Post Education Referrals:       [] WIC   [] PAP   [] Wound Care   [] Social Service   [] Home Health  [] Support Group   [] Physician Specialist   [] Rehabilitation   [] Other

## 2013-03-10 NOTE — Progress Notes (Signed)
Diabetes Self-Management Education Record    Participant Name: Christine Lynn  Referring Provider: Channing Mutters BROOKS  Assessment/Evaluation Ratings:  1=Needs Instruction   4=Demonstrates Understanding/Competency  2=Needs Review   NC=Not Covered    3=Comprehends Key Points  N/A=Not Applicable  Topics/Learning Objectives Pre-session Assess Date:  06/02/12 BDK Instr. Date Reinforce Date Post- session Eval Comments   Diabetes disease process & Treatment process: Define diabetes & pre-diabetes; Identify own type of diabetes; role of the pancreas; signs/symptoms; diagnostic criteria; prevention & treatment options; contributing factors. 2 06/02/12  BDK 03/10/13  BDK 3 Type 2   ND    03/10/13 Type 2 plus pregnant   Incorporating nutritional management into lifestyle: Describe effect of type, amount & timing of food on blood glucose; Describe basic meal planning techniques & current nutrition guidelines;Correctly read food labels & demonstrate CHO counting & portion control with personalized meal plan. Identify dining out strategies, & dietary sick day guidelines. 1 06/03/2012  DRH 06/04/2012  DRH 4 Pt attended Session 2.   Read labels correctly. Followed along and took notes.Participated in discussions.    Instruction on1800 calories/ 3-45 g meals/ 15 g AM snack/30 g PM/HS snack provided. Pt was able to plan  meals correctly using food models and cheat sheet.  See media, pt education for copy of meal plan/carb.     Incorporating physical activity into lifestyle:   Verbalize effect of exercise on blood glucose levels; benefits of regular exercise; safety considerations; contraindications; maintenance of activity. 1 06/02/12  BDK 03/10/13  BDK 3    Using medications safely:  Identify effects of diabetes medicines on blood glucose levels; List diabetes medication taken, action & side effects; appropriate injection sites; proper storage; supplies needed; proper technique; safe needle disposal guidelines. 2 06/02/12  BDK 03/10/13  BDK  3 metformin   Monitoring blood glucose, interpreting and using results:  Identify recommended & personal blood glucose targets; importance of testing; testing supplies; HgbA1C target levels; Factors affecting blood glucose; Importance of logging blood glucose levels for pattern recognition; ketone testing; safe lancet disposal. N/A 06/02/12  BDK 03/10/13  BDK 3 No meter    03/10/13 Freestyle Insulinx meter to test 4 times a day   Prevention, detection & treatment of acute complications:  Identify symptoms of hyper & hypoglycemia, and prevention & treatment strategies. Describe sick day guidelines & indications for ketone testing & physician notification. Identify short term consequences of poor control. 1 06/02/12  BDK 03/10/13  BDK 3    Prevention, detection & treatment of chronic complications:  Define the natural course of diabetes & describe the relationship of blood glucose levels to long term complications of diabetes. Identify preventative measures & standards of care. 1 06/02/12  BDK  3    Developing strategies to address psychosocial issues:  Describe feelings about living with diabetes; Describe how stress, depression & anxiety affect blood glucose; Identify coping strategies; Identify support needed & support network available. 1 06/02/12  BDK  3 Hands Depression Tool Score= 27  [] Physician notified of suicidal ideations   Developing strategies to promote health/change behavior: Identify 7 self-care behaviors; Personal health risk factors; Benefits, challenges & strategies for behavioral change; Individualized goal selection. 1 06/02/12  BDK 06/04/2012  DRH 3 Goal: Being active     Identified Barriers to learning/adherence to self management plan:     [x] None  [] Physical  [] Visual  [] Hearing  [] Speech  [] Emotional  [] Cognitive    [] Reading  [] Language  [] Accessibility  [x] Financial  Instruction Method: [x] Lecture/Discussion  [x] PowerPoint Presentation  [x] Handouts   [x] Return Demonstration  Education  Materials/Equipment Provided:  [x] Self-Management Manual  [x] Meal Plan [] Glucose Meter  [] Insulin Kit  [x] Nutritional Packet  [] Survival Packet   [] Other  [x]  Gestational Pathway Initiated & Signed    Encounter Type Date Start Time End Time Comments No Show Dates   Assessment 06/02/12 1800 1830   [x] In Person  [] Telephone    Session 1 06/02/12 1830 2100      Session 2 06/03/2012 1800 2030     Session 3 06/04/2012 1800 2030      Individual MNT         Gestational MNT         Shared Med Appt         Yearly Follow-up        Meter Instrx 03/10/13 1400 1430 Insulinx glucose meter    Insulin Instrx      [] Pen  [] Vial & Syringe      Additional Comments: Christine Lynn attended session 1 alone.  Very attentive to class materials.    03/10/13  Christine Lynn was seen for Type 2 and pregnant.  The gestational diabetes book information was reviewed with her.  She was instructed on the Freestyle Insulinx glucose meter.  Glucose was 88 approx 3 hours after lunch.  Gestational pathway was reviewed.  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      DSMS Support Plan:  Follow-up plan/Date: 10/13  Contact Post Class Regarding:   Fasting Blood Sugar   HgbA1C   Weight   Hypertension/Follow-up with Physician   Self-Foot Exam Frequency   Monitoring Frequency   Exercise Routine   Goal Attainment  Post Education Referrals:       [] WIC   [] PAP   [] Wound Care   [] Social Service   [] Home Health  [] Support Group   [] Physician Specialist   [] Rehabilitation   [] Other

## 2013-06-06 LAB — TSH: TSH: 1.2 u[IU]/mL (ref 0.270–4.200)

## 2013-06-06 LAB — T4, FREE: T4 Free: 1.01 ng/dL (ref 0.93–1.70)

## 2013-07-28 NOTE — Discharge Instructions (Signed)
Home Undelivered Discharge Instructions    After Discharge Orders:    KEEP TOMORROW'S APPOINTMENT   Call physician as needed             Diet:Regular     Rest: as needed    Call physician  return to Labor and Delivery, call 911, or go to the nearest Emergency Room if:any concerns

## 2013-07-28 NOTE — Other (Signed)
Pt walked on floor with c/o lt groin pain x 4 days,increase when walks,EDC 10/15/2013.Pt is 28 5/7 weeks,urine dip is negative

## 2013-07-28 NOTE — Other (Signed)
Pt discharged in stable condition

## 2013-07-28 NOTE — Other (Signed)
T/C to Dr Renato Gails received.

## 2013-07-28 NOTE — Other (Signed)
Discharge instructions given, pt verbalizes understanding

## 2013-07-31 LAB — T4, FREE: T4 Free: 0.84 ng/dL — ABNORMAL LOW (ref 0.93–1.70)

## 2013-07-31 LAB — TSH: TSH: 0.835 u[IU]/mL (ref 0.270–4.200)

## 2013-07-31 NOTE — Progress Notes (Signed)
Diagnosis: Musculoskeletal pain left groin area

## 2013-08-31 LAB — CBC WITH AUTO DIFFERENTIAL
Basophils %: 0 % (ref 0–2)
Basophils Absolute: 0 E9/L (ref 0.00–0.20)
Eosinophils %: 1 % (ref 0–6)
Eosinophils Absolute: 0.09 E9/L (ref 0.05–0.50)
Hematocrit: 31.5 % — ABNORMAL LOW (ref 34.0–48.0)
Hemoglobin: 10.3 g/dL — ABNORMAL LOW (ref 11.5–15.5)
Lymphocytes %: 13 % — ABNORMAL LOW (ref 20–42)
Lymphocytes Absolute: 1.13 E9/L — ABNORMAL LOW (ref 1.50–4.00)
MCH: 28.7 pg (ref 26.0–35.0)
MCHC: 32.8 % (ref 32.0–34.5)
MCV: 87.5 fL (ref 80.0–99.9)
MPV: 9.8 fL (ref 7.0–12.0)
Monocytes %: 5 % (ref 2–12)
Monocytes Absolute: 0.44 E9/L (ref 0.10–0.95)
Neutrophils %: 81 % — ABNORMAL HIGH (ref 43–80)
Neutrophils Absolute: 7.05 E9/L (ref 1.80–7.30)
PLATELET SLIDE REVIEW: NORMAL
Platelets: 175 E9/L (ref 130–450)
RBC Morphology: NORMAL
RBC: 3.6 E12/L (ref 3.50–5.50)
RDW: 13.5 fL (ref 11.5–15.0)
WBC: 8.7 E9/L (ref 4.5–11.5)

## 2013-08-31 LAB — URINALYSIS
Bilirubin Urine: NEGATIVE
Blood, Urine: NEGATIVE
Glucose, Ur: NEGATIVE mg/dL
Ketones, Urine: 40 mg/dL — AB
Nitrite, Urine: NEGATIVE
Specific Gravity, UA: >=1.03 (ref 1.005–1.030)
Urobilinogen, Urine: 1 E.U./dL (ref <2.0)
pH, UA: 6 (ref 5.0–9.0)

## 2013-08-31 LAB — COMPREHENSIVE METABOLIC PANEL
ALT: 10 U/L (ref 0–32)
AST: 15 U/L (ref 0–31)
Albumin: 3.4 g/dL — ABNORMAL LOW (ref 3.5–5.2)
Alkaline Phosphatase: 79 U/L (ref 35–104)
BUN: 5 mg/dL — ABNORMAL LOW (ref 6–20)
CO2: 23 mmol/L (ref 22–29)
Calcium: 9.2 mg/dL (ref 8.6–10.2)
Chloride: 102 mmol/L (ref 98–107)
Creatinine: 0.5 mg/dL (ref 0.5–1.0)
GFR African American: >60
GFR Non-African American: >60 mL/min/{1.73_m2} (ref >=60)
Glucose: 76 mg/dL (ref 74–109)
Potassium: 3.8 mmol/L (ref 3.5–5.0)
Sodium: 137 mmol/L (ref 132–146)
Total Bilirubin: 0.4 mg/dL (ref 0.0–1.2)
Total Protein: 6.2 g/dL — ABNORMAL LOW (ref 6.4–8.3)

## 2013-08-31 LAB — MICROSCOPIC URINALYSIS

## 2013-08-31 LAB — URIC ACID: Uric Acid, Serum: 3.6 mg/dL (ref 2.4–5.7)

## 2013-08-31 MED FILL — LEVOTHYROXINE SODIUM 125 MCG PO TABS: 125 MCG | ORAL | Qty: 1

## 2013-08-31 NOTE — Progress Notes (Signed)
Dr. Bobette Mo called for orders.

## 2013-08-31 NOTE — Progress Notes (Signed)
Patient sent here from Dr. Drema Halon office.  Has been having headaches since yesterday.  EFM applied.  Oriented to room

## 2013-08-31 NOTE — Progress Notes (Signed)
Lab results called to Dr. Bobette Mo.  Encourage patient to drink fluids.  Plan of care discussed with patient.  Verbalized understanding.  No questions voiced when asked.

## 2013-09-01 MED ADMIN — methyldopa (ALDOMET) tablet 250 mg: ORAL | @ 02:00:00 | NDC 00093293101

## 2013-09-01 MED ADMIN — methyldopa (ALDOMET) tablet 250 mg: ORAL | @ 16:00:00 | NDC 00093293101

## 2013-09-01 MED ADMIN — methyldopa (ALDOMET) tablet 250 mg: ORAL | @ 09:00:00 | NDC 00093293101

## 2013-09-01 MED ADMIN — levothyroxine (SYNTHROID) tablet 125 mcg: ORAL | @ 11:00:00 | NDC 00074706811

## 2013-09-01 MED FILL — METHYLDOPA 250 MG PO TABS: 250 MG | ORAL | Qty: 1

## 2013-09-01 MED FILL — LEVOTHYROXINE SODIUM 125 MCG PO TABS: 125 MCG | ORAL | Qty: 1

## 2013-09-01 NOTE — Progress Notes (Addendum)
Pt denies any H/A, visual disturbances or abd pain. Pt states gets migraines at home intermittently esp at night and takes tylenol once or twice a day and takes her aldomet 250mg  4x/day. States perceives fetal movement. Pt states taking PO fluids w/o nausea.

## 2013-09-01 NOTE — Discharge Instructions (Signed)
Home Undelivered Discharge Instructions    After Discharge Orders:                     Diet: drink plenty of fluids     Rest: normal activity as tolerated      Call physician or midwife, return to Labor and Delivery, call 911, or go to the nearest Emergency Room if: cramping, contractions, leaking fluid or bleeding or decreased fetal movement                Weeks 32 to 34 of Your Pregnancy: After Your Visit  Your Care Instructions  During the last few weeks of your pregnancy, you may have more aches and pains. It's important to rest when you can.  Your growing baby is putting more pressure on your bladder. So you may need to urinate more often. Hemorrhoids are also common. These are painful, itchy veins in the rectal area.  In the 36th week, most women have a test for group B streptococcus (GBS). GBS is a common bacteria that can live in the vagina and rectum. It can make your baby sick after birth. If you test positive, you will get antibiotics during labor. These will keep your baby from getting the bacteria.  You may want to talk with your doctor about banking your baby's umbilical cord blood. This is the blood left in the cord after birth. If you want to save this blood, you must arrange it ahead of time. You can't decide at the last minute.  If you haven't already had the Tdap shot during this pregnancy, talk to your doctor about getting it. It will help protect your newborn against pertussis infection.  Follow-up care is a key part of your treatment and safety. Be sure to make and go to all appointments, and call your doctor if you are having problems. It's also a good idea to know your test results and keep a list of the medicines you take.  How can you care for yourself at home?  Ease hemorrhoids   Get more liquids, fruits, vegetables, and fiber in your diet. This will help keep your stools soft.   Avoid sitting for too long. Lie on your left side several times a day.   Clean yourself with soft, moist  toilet paper. Or you can use witch hazel pads or personal hygiene pads.   If you are uncomfortable, try ice packs. Or you can sit in a warm sitz bath. Do these for 20 minutes at a time, as needed.   Use hydrocortisone cream for pain and itching. Two examples are Anusol and Preparation H Hydrocortisone.   Ask your doctor about taking an over-the-counter stool softener.  Consider breast-feeding   Experts recommend that women breast-feed for 1 year or longer. Breast milk is the perfect food for babies.   Breast milk is easier for babies to digest than formula. And it is always available, just the right temperature, and free.   In general, babies who are breast-fed are healthier than formula-fed babies.   Breast-fed babies are less likely to get ear infections, colds, diarrhea, and pneumonia.   Breast-fed babies who are fed only breast milk are less likely to get asthma and allergies.   Breast-fed babies are less likely to be obese.   Breast-fed babies are less likely to get diabetes or heart disease.   Women who breast-feed have less bleeding after the birth. Their uteruses also shrink back faster.   Some  women who breast-feed lose weight faster. Making milk burns calories.   Breast-feeding can lower your risk of breast cancer, ovarian cancer, and osteoporosis.  Decide about circumcision for boys   As you make this decision, it may help to think about your personal, religious, and family traditions. You get to decide if you will keep your son's penis natural or if he will be circumcised.   If you decide that you would like to have your baby circumcised, talk with your doctor. You can share your concerns about pain. And you can discuss your preferences for anesthesia.   Where can you learn more?   Go to [email or web address] and sign in to your MyChart account. Enter [phone] in the Search Health Information box to learn more about "Weeks 32 to 17 of Your Pregnancy: After Your Visit."    If  you do not have an account, please click on the "Sign Up Now" link.      2006-2014 [employer]. Care instructions adapted under license by [facility]. This care instruction is for use with your licensed healthcare professional. If you have questions about a medical condition or this instruction, always ask your healthcare professional. [employer] disclaims any warranty or liability for your use of this information.  Content Version: 10.0.273164; Last Revised: July 14, 2012

## 2013-09-01 NOTE — Progress Notes (Signed)
Doing well, headache now markedly improved.  Satisfactory control of the hypertension with current dose of Aldomet, no changes made.  No evidence of pre-eclampsia.  EFM within normal limits, reactive fetal tracing.  No contractions or c/o abdominal pain.    A: Headache, was severe prior to admission, now settled.      Hypertension, essential, preceding pregnancy, satisfactory control and no pre-eclampsia.      Hypothyroidism.      Type 2 DM preceding pregnancy, diet controlled.    P: Schedule NST Sat 09/04/13       Office follow up in 1 week.

## 2013-09-01 NOTE — Progress Notes (Signed)
Pt states ready for discharge. Discharge instructions reviewed w/ pt w/ understanding verbalized; pt denies any questions or concerns. Pt discharged to home in apparently stable condition w/ pregnancy maintained.

## 2013-09-04 NOTE — Progress Notes (Signed)
Dr Bobette Mobaky notified of patient reactive nst, and blood pressures, orders to discharge received

## 2013-09-10 NOTE — Progress Notes (Signed)
DR. Bobette Mo called about nonreactive NST tracing. BPP ordered.

## 2013-09-10 NOTE — Progress Notes (Signed)
NST for hypertension in pregnancy

## 2013-09-10 NOTE — Progress Notes (Signed)
Dr. Bobette Mo called and message left for BPP results.

## 2013-09-10 NOTE — Progress Notes (Signed)
Pt here for NST due to diabetes. Pt g2p0 and [redacted] weeks pregnant. EFM applied.

## 2013-09-10 NOTE — Progress Notes (Signed)
Dr. Bobette Mo updated on EFM strip and BPP results. Dr. Bobette Mo stated it is ok to send patient home and he will discuss results with DR. El-Azeem and Dr. Ples Specter will contact the patient if she needs a repeat ultrasound. Information relayed to patient and patient discharged to home.

## 2013-09-12 NOTE — Progress Notes (Signed)
NST for hypertension in pregnancy

## 2013-09-12 NOTE — Other (Signed)
Pt instructed to call Dr Payton Mccallum hazeem to be seen on Tuesday and Dr Bobette Mo later in the week.Pt verbalizes understanding.

## 2013-09-12 NOTE — Other (Signed)
Pt ambulated on floor for NST.Pt is 35 3/7 weeks.

## 2013-09-17 NOTE — Progress Notes (Deleted)
NST faxed to Dr. Khavari's office.

## 2013-09-24 NOTE — Progress Notes (Signed)
Pt is 32 yo g2p0 here at 37 weeks for NST d/t diabetes, HTN and hypothroidism; pt states perceives fetal mvmt; denies any cramping, cxn, LOF or bleeding.

## 2013-10-04 ENCOUNTER — Inpatient Hospital Stay: Admit: 2013-10-04 | Source: Home / Self Care | Attending: Obstetrics & Gynecology | Admitting: Obstetrics & Gynecology

## 2013-10-04 LAB — CBC WITH AUTO DIFFERENTIAL
Basophils %: 0 % (ref 0–2)
Basophils Absolute: 0.02 E9/L (ref 0.00–0.20)
Eosinophils %: 1 % (ref 0–6)
Eosinophils Absolute: 0.06 E9/L (ref 0.05–0.50)
Hematocrit: 34.1 % (ref 34.0–48.0)
Hemoglobin: 11.4 g/dL — ABNORMAL LOW (ref 11.5–15.5)
Lymphocytes %: 20 % (ref 20–42)
Lymphocytes Absolute: 1.85 E9/L (ref 1.50–4.00)
MCH: 28.9 pg (ref 26.0–35.0)
MCHC: 33.4 % (ref 32.0–34.5)
MCV: 86.7 fL (ref 80.0–99.9)
MPV: 10.3 fL (ref 7.0–12.0)
Monocytes %: 7 % (ref 2–12)
Monocytes Absolute: 0.65 E9/L (ref 0.10–0.95)
Neutrophils %: 72 % (ref 43–80)
Neutrophils Absolute: 6.54 E9/L (ref 1.80–7.30)
Platelets: 179 E9/L (ref 130–450)
RBC: 3.94 E12/L (ref 3.50–5.50)
RDW: 14.1 fL (ref 11.5–15.0)
WBC: 9.1 E9/L (ref 4.5–11.5)

## 2013-10-04 LAB — TYPE AND SCREEN
ABO/Rh: B POS
Antibody Screen: NEGATIVE

## 2013-10-04 MED ORDER — FENTANYL AND BUPIVACAINE (OB) 15 ML SYRINGE (HOME CARE)
Freq: Once | Status: AC
Start: 2013-10-04 — End: 2013-10-04
  Administered 2013-10-04: 16:00:00 via EPIDURAL

## 2013-10-04 MED ORDER — CITRIC ACID-SODIUM CITRATE 334-500 MG/5ML PO SOLN
334-500 MG/5ML | ORAL | Status: DC | PRN
Start: 2013-10-04 — End: 2013-10-07

## 2013-10-04 MED ORDER — EPHEDRINE SULFATE 50 MG/ML IJ SOLN
50 MG/ML | INTRAMUSCULAR | Status: DC | PRN
Start: 2013-10-04 — End: 2013-10-04

## 2013-10-04 MED ORDER — LIDOCAINE HCL (PF) 1 % IJ SOLN
1 | INTRAMUSCULAR | Status: AC | PRN
Start: 2013-10-04 — End: 2013-10-04

## 2013-10-04 MED ORDER — LACTATED RINGERS IV BOLUS
Freq: Once | INTRAVENOUS | Status: DC
Start: 2013-10-04 — End: 2013-10-07

## 2013-10-04 MED ORDER — EPHEDRINE SULFATE 50 MG/ML IJ SOLN
50 MG/ML | INTRAMUSCULAR | Status: DC | PRN
Start: 2013-10-04 — End: 2013-10-07

## 2013-10-04 MED ORDER — DINOPROSTONE 10 MG VA INST
10 | Freq: Once | VAGINAL | Status: AC | PRN
Start: 2013-10-04 — End: 2013-10-04

## 2013-10-04 MED ORDER — FENTANYL AND BUPIVACAINE (OB) INFUSION 135 ML (HOME CARE)
Status: DC
Start: 2013-10-04 — End: 2013-10-07
  Administered 2013-10-04 (×2): via EPIDURAL

## 2013-10-04 MED ADMIN — lactated ringers infusion: INTRAVENOUS | @ 13:00:00 | NDC 00338011704

## 2013-10-04 MED ADMIN — oxytocin (PITOCIN) 30 units in 500 mL infusion: INTRAVENOUS | @ 14:00:00 | NDC 09999990087

## 2013-10-04 MED ADMIN — lactated ringers infusion: INTRAVENOUS | @ 23:00:00 | NDC 00338011704

## 2013-10-04 MED ADMIN — penicillin G potassium in d5w IVPB 2.5 Million Units: 2.5 10*6.[IU] | INTRAVENOUS | @ 22:00:00 | NDC 09999990240

## 2013-10-04 MED ADMIN — penicillin G potassium 5 Million Units in dextrose 5 % 100 mL IVPB (mini-bag): INTRAVENOUS | @ 14:00:00 | NDC 00338055118

## 2013-10-04 MED ADMIN — penicillin G potassium in d5w IVPB 2.5 Million Units: 2.5 10*6.[IU] | INTRAVENOUS | @ 18:00:00 | NDC 09999990240

## 2013-10-04 MED FILL — PENICILLIN G POTASSIUM IN D5W 2500000 UNIT/100 ML IV SOLN: 2500000 unit | INTRAVENOUS | Qty: 100

## 2013-10-04 MED FILL — FENTANYL AND BUPIVACAINE (OB) 15 ML SYRINGE (HOME CARE): Qty: 15

## 2013-10-04 MED FILL — OXYTOCIN 30 UNITS IN 500 ML INFUSION: 30 UNIT/500ML | INTRAVENOUS | Qty: 500

## 2013-10-04 MED FILL — FENTANYL AND BUPIVACAINE (OB) INFUSION 135 ML (HOME CARE): Qty: 135

## 2013-10-04 MED FILL — DEXTROSE 5 % IV SOLN: 5 % | INTRAVENOUS | Qty: 100

## 2013-10-04 MED FILL — PFIZERPEN-G 5000000 UNITS IJ SOLR: 5000000 units | INTRAMUSCULAR | Qty: 5

## 2013-10-04 NOTE — Progress Notes (Signed)
rn at bedside, searching for fht with monitor

## 2013-10-04 NOTE — Progress Notes (Signed)
Pt sleeping with epidural. No complaints of pain. Family bedside. IV pitocin infusing. FHTs monitored and assessed every 15 minutes.

## 2013-10-04 NOTE — Progress Notes (Signed)
Dr Juel Burrowlin rebolus epidural

## 2013-10-04 NOTE — Progress Notes (Signed)
Repositioned to high fowlers

## 2013-10-04 NOTE — Progress Notes (Signed)
Dr Juel Burrowlin notified of epidural not working

## 2013-10-04 NOTE — Progress Notes (Signed)
Assessed pt again for discomfort with contractions.  Pt had been comfortable for 2 hrs after last bolus, now feeling significant contraction pain again.  Pt with sensory block just up to thighs.  Pt given additional 15 cc standard bolus.      BP, spO2 continuously monitored. Fetal tracing reassuring.     Increased epidural infusion increased to 19 cc/hr, same demand dosing.  Instructed patient to report any signs of a high block, such as n/v, lightheadedness, or high sensory block    Myriam Forehand, MD  October 04, 2013  6:29 PM

## 2013-10-04 NOTE — Anesthesia Pre-Procedure Evaluation (Signed)
Earnestine Leys         Department of Anesthesiology  Pre-Anesthesia Evaluation/Consultation       Name:  BRYLIE SNEATH                                         Age:  32 y.o.  MRN:  [id number]           Procedure (Scheduled):  Vaginal Delivery  Surgeon:  Dr. Bobette Mo     No Known Allergies  There is no problem list on file for this patient.    Past Medical History   Diagnosis Date   ??? Hypertension    ??? Anxiety    ??? Abnormal Pap smear      2011   ??? Diabetes mellitus (HCC)      diet controlled   ??? Thyroid disease      hypothyroid     History reviewed. No pertinent past surgical history.  History   Substance Use Topics   ??? Smoking status: Never Smoker    ??? Smokeless tobacco: Never Used   ??? Alcohol Use: No     Medications  No current facility-administered medications on file prior to encounter.     Current Outpatient Prescriptions on File Prior to Encounter   Medication Sig Dispense Refill   ??? methyldopa (ALDOMET) 250 MG tablet Take 250 mg by mouth 3 times daily.       ??? levothyroxine (SYNTHROID) 100 MCG tablet Take 125 mcg by mouth Daily.       ??? prenatal vitamin (PRENATAL-S) 27-0.8 MG TABS Take 1 tablet by mouth daily.       ??? metFORMIN (GLUCOPHAGE) 500 MG tablet Take 500 mg by mouth 2 times daily (with meals).       ??? lisinopril-hydrochlorothiazide (PRINZIDE;ZESTORETIC) 10-12.5 MG per tablet Take 1 tablet by mouth daily.         Current Facility-Administered Medications   Medication Dose Route Frequency Provider Last Rate Last Dose   ??? sodium chloride flush 0.9 % injection 10 mL  10 mL Intravenous Q12H [facility] Lorelei Pont, MD       ??? sodium chloride flush 0.9 % injection 10 mL  10 mL Intravenous PRN Lorelei Pont, MD       ??? lidocaine 1 % (PF) injection 30 mL  30 mL Other PRN Lorelei Pont, MD       ??? nalbuphine (NUBAIN) injection 10 mg  10 mg Intravenous Q2H PRN Lorelei Pont, MD       ??? dinoprostone (CERVIDIL) vaginal insert 10 mg  10 mg Vaginal Once PRN Lorelei Pont, MD       ??? oxytocin (PITOCIN) 30 units in 500 mL infusion  1  milli-units/min Intravenous Continuous Lorelei Pont, MD 10 mL/hr at 10/04/13 1145 10 milli-units/min at 10/04/13 1145   ??? docusate sodium (COLACE) capsule 100 mg  100 mg Oral BID Lorelei Pont, MD       ??? ondansetron (ZOFRAN) injection 4 mg  4 mg Intravenous Q6H PRN Lorelei Pont, MD       ??? terbutaline (BRETHINE) injection 0.25 mg  0.25 mg Subcutaneous PRN Lorelei Pont, MD       ??? vancomycin 1000 mg IVPB in 250 mL D5W addavial  1,000 mg Intravenous Q12H Lorelei Pont, MD       ??? clindamycin (CLEOCIN) 900 mg in dextrose 5 % 50 mL IVPB  900 mg Intravenous Q8H Lorelei Pont, MD       ??? lactated ringers infusion   Intravenous Continuous Lorelei Pont, MD 999 mL/hr at 10/04/13 1140     ??? penicillin G potassium in d5w IVPB 2.5 Million Units  2.5 Million Units Intravenous Q4H Lorelei Pont, MD       ??? penicillin G potassium 5 Million Units in dextrose 5 % 100 mL IVPB (mini-bag)  5 Million Units Intravenous Q4H Lorelei Pont, MD   5 Million Units at 10/04/13 0955   ??? penicillin G potassium 5000000 UNITS injection            ??? dextrose 5 % solution            ??? citric acid-sodium citrate (BICITRA) solution 30 mL  30 mL Oral PRN Myriam Forehand, MD       ??? ePHEDrine injection 5 mg  5 mg Intravenous PRN Myriam Forehand, MD       ??? fentaNYL 1.45mcg/ml and bupivacaine 0.1% 15ml syringe (Home Care) (OB) 15 mL epidural  15 mL Epidural Once Myriam Forehand, MD       ??? fentaNYL 1.39mcg/ml and Bupivicaine 0.1% in 0.9% NS infusion (OB) epidural  15 mL/hr Epidural Continuous Myriam Forehand, MD       ??? lactated ringers bolus  1,000 mL Intravenous Once Myriam Forehand, MD         Vital Signs (Current)   Filed Vitals:    10/04/13 1146   BP: 133/77   Pulse: 88   Temp:    Resp: 18     Vital Signs Statistics (for past 48 hrs)     BP  Min: 126/71   Min taken time: 10/04/13 1030  Max: 145/77   Max taken time: 10/04/13 0946  Temp  Avg: 98.3 ??F (36.8 ??C)  Min: 98.3 ??F (36.8 ??C)   Min taken time: 10/04/13 0831  Max: 98.3 ??F (36.8 ??C)   Max taken time: 10/04/13 0831  Pulse  Avg: 84.3   Min: 78   Min taken time: 10/04/13 1000  Max: 88   Max taken time: 10/04/13 1146  Resp  Avg: 17.7  Min: 16   Min taken time: 10/04/13 0831  Max: 18   Max taken time: 10/04/13 1146    BP Readings from Last 3 Encounters:   10/04/13 133/77   09/24/13 122/67   09/17/13 131/77     BMI  Body mass index is 45.54 kg/(m^2).  Estimated body mass index is 45.54 kg/(m^2) as calculated from the following:    Height as of this encounter: 5\' 6"  (1.676 m).    Weight as of this encounter: 282 lb (127.914 kg).    CBC   Lab Results   Component Value Date    WBC 9.1 10/04/2013    RBC 3.94 10/04/2013    HGB 11.4 10/04/2013    HCT 34.1 10/04/2013    MCV 86.7 10/04/2013    RDW 14.1 10/04/2013    PLT 179 10/04/2013     CMP    Lab Results   Component Value Date    NA 137 08/31/2013    K 3.8 08/31/2013    CL 102 08/31/2013    CO2 23 08/31/2013    BUN 5 08/31/2013    CREATININE 0.5 08/31/2013    GFRAA >60 08/31/2013    LABGLOM >60 08/31/2013    LABGLOM >60 02/13/2013    GLUCOSE 76 08/31/2013    GLUCOSE  92 03/12/2010    PROT 6.2 08/31/2013    CALCIUM 9.2 08/31/2013    BILITOT 0.4 08/31/2013    ALKPHOS 79 08/31/2013    AST 15 08/31/2013    ALT 10 08/31/2013     BMP    Lab Results   Component Value Date    NA 137 08/31/2013    K 3.8 08/31/2013    CL 102 08/31/2013    CO2 23 08/31/2013    BUN 5 08/31/2013    CREATININE 0.5 08/31/2013    CALCIUM 9.2 08/31/2013    GFRAA >60 08/31/2013    LABGLOM >60 08/31/2013    LABGLOM >60 02/13/2013    GLUCOSE 76 08/31/2013    GLUCOSE 92 03/12/2010     POCGlucose  No results found for this basename: GLUCOSE,  in the last 72 hours   Coags    No results found for this basename: PROTIME, INR, APTT     HCG (If Applicable)   Lab Results   Component Value Date    PREGTESTUR negative 02/29/2012    HCG 56736.0* 03/02/2013      ABGs   No results found for this basename: PHART, PO2ART, PCO2ART, HCO3ART, BEART, O2SATART      Type & Screen (If Applicable)  No results found for this basename: LABABO, LABRH     Radiology (If Applicable)  Cardiac Testing (If Applicable)   EKG (If  Applicable)      Anesthesia Evaluation     Patient summary reviewed and Nursing notes reviewed    No history of anesthetic complications   Airway   Mallampati: III  TM distance: >3 FB  Neck ROM: full  Dental - normal exam     Pulmonary - normal exam   Cardiovascular - normal exam  (+) hypertension,     Rhythm: regular  Rate: normal    Neuro/Psych    Comments: Anxiety    GI/Hepatic/Renal      Endo/Other    (+) type II diabetes, hypothyroidism    Comments: Labor Pain  38 and 3/[redacted] Weeks gestation  2Cm dilated  G2 / P0 / AB1  Abdominal   (+) obese,                   Allergies: Review of patient's allergies indicates no known allergies.    NPO Status:                              Anesthesia Plan    ASA 3     epidural     Anesthetic plan and risks discussed with patient.    Plan discussed with attending.          Donetta Potts, CRNA  10/04/2013

## 2013-10-04 NOTE — Progress Notes (Signed)
Dr Bobette Mobaky notified via phone of patient status, ve 3cm, 90%, fse reapplied, fht with variables, early, and accelerations, patient comfortable. No new orders

## 2013-10-04 NOTE — Progress Notes (Signed)
Dr Bobette Mobaky notified of paitent  9cm... Dr Bobette Mobaky on his way to hospital

## 2013-10-04 NOTE — Progress Notes (Signed)
Assessed pt for discomfort with contractions.  Pt states she has been feeling contractions worse 6/10 over last few hours.  Moving lower extrimities, with some subjective heaviness and numbness in lower legs.  Epidural site examined, c/d/i.  Discussed with pt options, including bolusing, and redosing.  Elected to bolus 15 cc 0.1% bupivicaine with 1.85 mcg/cc fentanyl.  Pt remain heomdynamically stable.  Continuously monitored.  Fetal tracing reassuring.  Pt now comfortable after reassessing 30 minutes later.    Will continue epidural infusion as ordered.    Myriam Forehand, MD  October 04, 2013  3:34 PM

## 2013-10-04 NOTE — Progress Notes (Signed)
Anesthesia notified of patient epidural no loner working

## 2013-10-04 NOTE — Plan of Care (Signed)
Problem: Anxiety  Goal: Alleviation of anxiety  Outcome: Met This Shift  Goal: STG-Knowledge of positive coping patterns  Outcome: Met This Shift    Problem: Breathing Pattern - Ineffective  Goal: Able to breathe comfortably  Outcome: Met This Shift    Problem: Fluid Volume - Imbalance  Goal: Absence of imbalanced fluid volume signs and symptoms  Outcome: Met This Shift  Goal: Absence of intrapartum hemorrhage signs and symptoms  Outcome: Met This Shift    Problem: Infection-Intrapartum Infection  Goal: Absence of infection signs and symptoms  Outcome: Met This Shift    Problem: Labor Process-Prolonged  Goal: Labor progression, first stage, within specified pattern  Outcome: Met This Shift  Goal: Labor progression, second stage, within specified pattern  Outcome: Met This Shift  Goal: Uterine contraction within specified parameters  Outcome: Met This Shift    Problem: Antenatal Screening  Goal: Able to make informed decisions regarding Antenatal screening test results  Outcome: Met This Shift    Problem: Pain - Acute  Goal: Reduction in pain sensation  Outcome: Met This Shift  Goal: Able to cope with pain  Outcome: Met This Shift    Problem: Tissue Perfusion - Uteroplacental, Altered  Goal: Absence of abnormal fetal heart rate pattern  Outcome: Met This Shift    Problem: Urinary Retention  Goal: Absence of bladder overdistention  Outcome: Met This Shift  Goal: Urinary elimination within specified parameters  Outcome: Met This Shift

## 2013-10-04 NOTE — Progress Notes (Signed)
Dr Juel Burrowlin  At bedside, assessment of epidural

## 2013-10-04 NOTE — Progress Notes (Signed)
Dr Bobette Mobaky  At bedside,, ve done

## 2013-10-04 NOTE — Progress Notes (Signed)
fse fell off, unable to reapply another scalp electrode, cervix posterior.. External monitor applied

## 2013-10-04 NOTE — Progress Notes (Signed)
fse applied

## 2013-10-04 NOTE — Progress Notes (Signed)
Patient repositioned to right side

## 2013-10-04 NOTE — Progress Notes (Signed)
M.Lang CRNA here at bedside for Epidural.

## 2013-10-04 NOTE — Progress Notes (Signed)
Dr.Baky updated on patients arrival with orders received for antibiotics for +GBS status. Orders that patient have epidural this morning prior to his arrival at lunch when he plans to rupture patients membranes.

## 2013-10-04 NOTE — Progress Notes (Signed)
States relief w'ith epidural bolus

## 2013-10-04 NOTE — Progress Notes (Signed)
Message left on dr Bobette Mobaky cell phone, patient 8cm,

## 2013-10-04 NOTE — Procedures (Signed)
Department of Anesthesiology  Continuous Labor Epidural Procedure Note    Name:  Christine Lynn                                         Age: 32 y.o.  MRN:  [id number]    Attending Obstetrician:  Lorelei Pont, MD  Procedure: Labor Epidural [54098]     Diagnosis: Vaginal Delivery [650]    Procedure Start Time:  1200  Procedure End Time:  1230    Allergies:  Review of patient's allergies indicates no known allergies.    H&P Status: H&P was reviewed, the patient was examined and no change has occurred in patient's condition since H&P was completed.    Diagnostic Data Review:  PT/INR    No components found with this basename: PTPATIENT, PTINR     PT/INR Warfarin:    No components found with this basename: PTPATWAR, PTINRWAR     PTT:   No results found for this basename: APTT     PTT Heparin:   No components found with this basename: APTTHEP     CBC:   Lab Results   Component Value Date    WBC 9.1 10/04/2013    RBC 3.94 10/04/2013    HGB 11.4 10/04/2013    HCT 34.1 10/04/2013    MCV 86.7 10/04/2013    RDW 14.1 10/04/2013    PLT 179 10/04/2013       Consent:  Risks and benefits of the labor epidural were discussed and the patient was given the opportunity to ask questions. Informed consent was obtained.    Procedure:  Position: Sitting.  Sterile technique: Hat,mask,gloves.    Skin Prep/Drape: Betadine.  Skin Local: 3ml 1% lidocaine  Interspace: L3-L4   Catheter Distances:  Skin to epidural space 9cm.  Catheter 14cm at skin.  Aspiration for CSF/Blood: Negative    Paresthesia: Negative    Test dose: Negative (3ml 1.5%Lidocaine with 1:200,000 Epinephrine)    Difficulties/Complications: None    Epidural Medication:  Bolus Dose:   Premixed Syringe: Bupivacaine 0.1% with Fentanyl 1.85 mcg/ml (PF) 15ml given. Injection was made incrementally with constant monitoring and aspiration.  Infusion Dose:  Premixed Bag: Bupivacaine 0.1% with Fentanyl 1.85 mcg/ml started at 51ml/hr.    Donetta Potts, CRNA (ATTENDING ANESTHESIOLOGIST: Juel Burrow)       12:22 PM

## 2013-10-04 NOTE — Progress Notes (Signed)
Dr.Linn notified that epidural not working well-patient using epidural PCA but that has not helped. Dr. Andrey Cota he will come to evaluate epidural as soon as possible.

## 2013-10-04 NOTE — Progress Notes (Signed)
Sitting high f owlers

## 2013-10-04 NOTE — Progress Notes (Signed)
Department of Anesthesiology  Continuous Labor Epidural Note - Periodic Monitoring Note    Name:  Earnestine Leys                                         Age: 32 y.o.  MRN:  [id number]  Obstetrician:  Lorelei Pont, MD    Last Pain Score: (Charted in Doc Flowsheet)  Pain Level: 9    BP 126/71   Pulse 90   Temp(Src) 98.9 ??F (37.2 ??C) (Oral)   Resp 18   Ht 5\' 6"  (1.676 m)   Wt 282 lb (127.914 kg)   BMI 45.54 kg/m2   SpO2 100%   Breastfeeding? Yes    A/P: Mischell A Adel is a 32 y.o. year-old at [redacted]w[redacted]d with continuous labor epidural and good pain control - no.  Patient complains of severe pain 10/10.  Patient states that her right leg is very heavy but her left leg is not.  The epidural catheter was assessed, the catheter is still intact.  The patient was given a bolus of 2% lidocaine 10ml (PF) with Fentanyl incrementally with intermittent aspiration and continuous monitoring.  After 15 minutes the patient is very comfortable and is not feeling any pain with contractions at this time.    Jamal Maes, CRNA10/6/201411:16 PM

## 2013-10-04 NOTE — Progress Notes (Signed)
Patient reports being uncomfotable. epidura not working

## 2013-10-04 NOTE — Progress Notes (Signed)
Epidural bolus, A. USAAhomas  Scientist, clinical (histocompatibility and immunogenetics)CRNA

## 2013-10-05 MED ORDER — IBUPROFEN 800 MG PO TABS
800 | Freq: Three times a day (TID) | ORAL | Status: DC | PRN
Start: 2013-10-05 — End: 2013-10-07
  Administered 2013-10-05 – 2013-10-06 (×2): 800 mg via ORAL

## 2013-10-05 MED ORDER — HYDROCODONE-ACETAMINOPHEN 5-325 MG PO TABS
5-325 | ORAL | Status: DC | PRN
Start: 2013-10-05 — End: 2013-10-07
  Administered 2013-10-05 – 2013-10-07 (×4): 1 via ORAL

## 2013-10-05 MED ADMIN — penicillin G potassium in d5w IVPB 2.5 Million Units: 2.5 10*6.[IU] | INTRAVENOUS | @ 02:00:00 | NDC 09999990240

## 2013-10-05 MED ADMIN — docusate sodium (COLACE) capsule 100 mg: ORAL | @ 12:00:00 | NDC 63739047801

## 2013-10-05 MED ADMIN — levothyroxine (SYNTHROID) tablet 125 mcg: ORAL | @ 12:00:00 | NDC 00074706811

## 2013-10-05 MED ADMIN — methyldopa (ALDOMET) tablet 250 mg: ORAL | @ 19:00:00 | NDC 00093293101

## 2013-10-05 MED ADMIN — sodium chloride flush 0.9 % injection 10 mL: INTRAVENOUS | @ 12:00:00

## 2013-10-05 MED ADMIN — lidocaine 1 % (PF) injection 30 mL: @ 04:00:00 | NDC 00409427601

## 2013-10-05 MED ADMIN — methyldopa (ALDOMET) tablet 250 mg: ORAL | @ 12:00:00 | NDC 00093293101

## 2013-10-05 MED ADMIN — oxytocin (PITOCIN) 30 units in 500 mL infusion: INTRAVENOUS | @ 05:00:00 | NDC 09999990087

## 2013-10-05 MED ADMIN — LACTOCAL-F 1 tablet: ORAL | @ 12:00:00 | NDC 00277017901

## 2013-10-05 MED FILL — PRENATAL PLUS 27-1 MG PO TABS: 27-1 MG | ORAL | Qty: 1

## 2013-10-05 MED FILL — LEVOTHYROXINE SODIUM 125 MCG PO TABS: 125 MCG | ORAL | Qty: 1

## 2013-10-05 MED FILL — METFORMIN HCL 500 MG PO TABS: 500 MG | ORAL | Qty: 1

## 2013-10-05 MED FILL — METHYLDOPA 250 MG PO TABS: 250 MG | ORAL | Qty: 1

## 2013-10-05 MED FILL — DOCUSATE SODIUM 100 MG PO CAPS: 100 MG | ORAL | Qty: 1

## 2013-10-05 MED FILL — FENTANYL CITRATE 0.05 MG/ML IJ SOLN: 0.05 MG/ML | INTRAMUSCULAR | Qty: 2

## 2013-10-05 MED FILL — IBUPROFEN 800 MG PO TABS: 800 MG | ORAL | Qty: 1

## 2013-10-05 MED FILL — NORMAL SALINE FLUSH 0.9 % IV SOLN: 0.9 % | INTRAVENOUS | Qty: 10

## 2013-10-05 MED FILL — LIDOCAINE HCL (PF) 2 % IJ SOLN: 2 % | INTRAMUSCULAR | Qty: 10

## 2013-10-05 MED FILL — HYDROCODONE-ACETAMINOPHEN 5-325 MG PO TABS: 5-325 MG | ORAL | Qty: 1

## 2013-10-05 MED FILL — LACTOCAL-F PO TABS: ORAL | Qty: 1

## 2013-10-05 NOTE — Progress Notes (Signed)
Ambulated to bathroom. Gait steady.

## 2013-10-05 NOTE — Progress Notes (Signed)
Electric breast pump ordered through [facility] for mom baby separation.  Christine Lynn

## 2013-10-05 NOTE — Progress Notes (Signed)
SVD live female.

## 2013-10-05 NOTE — Progress Notes (Signed)
Delivery Log Form    Patient: Christine Lynn  Delivery Method: Delivery Method: N/A  /ate of Delivery: 10/05/13  Time of Delivery: 0023  Were you able to attend? Yes  Amount of time spend at delivery?15 minutes  Actions Taken: room air given  Apgars Assessed? Yes  Apgar Results:    1Min:APGAR One: N/A   : APGAR Five: N/A    LDA information:  Intubation: No  Outcomes: Baby came out crying, vigorous, and pinked up well after stimulation. Bulb suctioned mouth, strong cry, no need for oxygen or deep suctioning.     Problem List:   There is no problem list on file for this patient.      Ramiro Harvest

## 2013-10-05 NOTE — Progress Notes (Signed)
Transferred to 209 via WC after pericare/void. Oriented to room. Plan of care for night discussed. Pt verbalizes understanding. Instructed to call for assistance with urge to void.

## 2013-10-05 NOTE — Progress Notes (Signed)
Dr. Bobette MoBaky bedside, pushing encouraged.

## 2013-10-06 LAB — HEMOGLOBIN AND HEMATOCRIT
Hematocrit: 31.5 % — ABNORMAL LOW (ref 34.0–48.0)
Hemoglobin: 10.5 g/dL — ABNORMAL LOW (ref 11.5–15.5)

## 2013-10-06 LAB — POCT GLUCOSE: Meter Glucose: 84 mg/dL (ref 70–110)

## 2013-10-06 MED ADMIN — LACTOCAL-F 1 tablet: ORAL | @ 13:00:00 | NDC 00277017901

## 2013-10-06 MED ADMIN — methyldopa (ALDOMET) tablet 250 mg: ORAL | @ 18:00:00 | NDC 00093293101

## 2013-10-06 MED ADMIN — methyldopa (ALDOMET) tablet 250 mg: ORAL | @ 13:00:00 | NDC 00093293101

## 2013-10-06 MED ADMIN — levothyroxine (SYNTHROID) tablet 125 mcg: ORAL | @ 11:00:00 | NDC 00074706811

## 2013-10-06 MED ADMIN — methyldopa (ALDOMET) tablet 250 mg: ORAL | @ 01:00:00 | NDC 00093293101

## 2013-10-06 MED ADMIN — docusate sodium (COLACE) capsule 100 mg: ORAL | @ 01:00:00 | NDC 63739047801

## 2013-10-06 MED ADMIN — docusate sodium (COLACE) capsule 100 mg: ORAL | @ 13:00:00 | NDC 63739047801

## 2013-10-06 MED FILL — METFORMIN HCL 500 MG PO TABS: 500 MG | ORAL | Qty: 1

## 2013-10-06 MED FILL — DOCUSATE SODIUM 100 MG PO CAPS: 100 MG | ORAL | Qty: 1

## 2013-10-06 MED FILL — METHYLDOPA 250 MG PO TABS: 250 MG | ORAL | Qty: 1

## 2013-10-06 MED FILL — HYDROCODONE-ACETAMINOPHEN 5-325 MG PO TABS: 5-325 MG | ORAL | Qty: 1

## 2013-10-06 MED FILL — IBUPROFEN 800 MG PO TABS: 800 MG | ORAL | Qty: 1

## 2013-10-06 MED FILL — LEVOTHYROXINE SODIUM 125 MCG PO TABS: 125 MCG | ORAL | Qty: 1

## 2013-10-06 MED FILL — LACTOCAL-F PO TABS: ORAL | Qty: 1

## 2013-10-06 NOTE — Plan of Care (Signed)
Problem: Fluid Volume - Imbalance  Goal: Absence of imbalanced fluid volume signs and symptoms  Outcome: Met This Shift  Goal: Absence of postpartum hemorrhage signs and symptoms  Outcome: Met This Shift    Problem: Pain - Acute  Goal: Reduction in pain sensation  Outcome: Met This Shift    Problem: Mood - Altered  Goal: LTG-Mood stable  Outcome: Met This Shift

## 2013-10-06 NOTE — Progress Notes (Signed)
Doing well, no current complaints.   No c/o shortness of breath, leg pain or chest pain suggestive of DVT or pulmonary embolism.  No UT or GI complaints.   No breast complaints.  Lochia within normal limits.   No symptoms of depression.  Normotensive, afebrile. Resumed Aldomet and Synthroid.  H&H: 10.5 / 31.5%    A:  S/p vaginal delivery post-partum day 1       Hypertension, diabetes and hypothyroidism all controlled.       Mild iron deficiency anemia.    P:  Routine post-partum care.

## 2013-10-07 LAB — POCT GLUCOSE: Meter Glucose: 85 mg/dL (ref 70–110)

## 2013-10-07 MED ORDER — HYDROCODONE-ACETAMINOPHEN 5-325 MG PO TABS
5-325 MG | ORAL_TABLET | ORAL | Status: AC | PRN
Start: 2013-10-07 — End: 2013-10-14

## 2013-10-07 MED ORDER — IBUPROFEN 800 MG PO TABS
800 MG | ORAL_TABLET | Freq: Three times a day (TID) | ORAL | Status: AC | PRN
Start: 2013-10-07 — End: 2013-10-14

## 2013-10-07 MED ADMIN — LACTOCAL-F 1 tablet: ORAL | @ 12:00:00 | NDC 00277017901

## 2013-10-07 MED ADMIN — levothyroxine (SYNTHROID) tablet 125 mcg: ORAL | @ 12:00:00 | NDC 00074706811

## 2013-10-07 MED ADMIN — docusate sodium (COLACE) capsule 100 mg: ORAL | @ 01:00:00 | NDC 63739047801

## 2013-10-07 MED ADMIN — methyldopa (ALDOMET) tablet 250 mg: ORAL | @ 01:00:00 | NDC 00093293101

## 2013-10-07 MED ADMIN — docusate sodium (COLACE) capsule 100 mg: ORAL | @ 12:00:00 | NDC 63739047801

## 2013-10-07 MED ADMIN — methyldopa (ALDOMET) tablet 250 mg: ORAL | @ 12:00:00 | NDC 00093293101

## 2013-10-07 MED FILL — DOCUSATE SODIUM 100 MG PO CAPS: 100 MG | ORAL | Qty: 1

## 2013-10-07 MED FILL — LACTOCAL-F PO TABS: ORAL | Qty: 1

## 2013-10-07 MED FILL — METHYLDOPA 250 MG PO TABS: 250 MG | ORAL | Qty: 1

## 2013-10-07 MED FILL — LEVOTHYROXINE SODIUM 125 MCG PO TABS: 125 MCG | ORAL | Qty: 1

## 2013-10-07 MED FILL — HYDROCODONE-ACETAMINOPHEN 5-325 MG PO TABS: 5-325 MG | ORAL | Qty: 1

## 2013-10-07 MED FILL — METFORMIN HCL 500 MG PO TABS: 500 MG | ORAL | Qty: 1

## 2013-10-07 NOTE — Discharge Summary (Signed)
Obstetrical Discharge Form    Gestational Age:  [redacted]w[redacted]d    Antepartum complications: Pregestational Type 2 Diabetes      Hypertension      Hypothyroidism      Depression    Date of Delivery:   10/05/13      Type of Delivery:   vaginal, spontaneous    Delivered By:   Obstetrician: Dr. Bobette Mo        Episiotomy: None  Laceration: None    Baby:       Information for the patient's newborn:  Deniqua, Perry Girl [[id number]]   Sex: Female  Birthweight: 6 lb 14 oz (3.118 kg)  One Minute Apgar: 9  Five Minute Apgar: 9    Anesthesia:    Epidural    Intrapartum complications: None    Feeding method:   breast    Postpartum complications: anemia    Discharge Date:   10/07/2013    Plan:   Follow up    in 6 week(s)

## 2013-10-07 NOTE — Discharge Instructions (Signed)

## 2013-10-07 NOTE — Progress Notes (Signed)
Patient discharged with infant in car seat at 1230 to private car.

## 2013-10-07 NOTE — Progress Notes (Signed)
Patient requested tearfully to speak to lactation consultant in regard to infant not feeding and mother pumping.  Message left for priority to see patient.  Patient also requests glucose check prior to metformin; 85mg /dl.  Patient refused medication at this time.

## 2013-10-07 NOTE — Progress Notes (Signed)
Doing well, no current complaints.   No c/o shortness of breath, leg pain or chest pain suggestive of DVT or pulmonary embolism.  No UT or GI complaints.   Breast feeding, doing well. No breast complaints.  Lochia within normal limits.   No symptoms of depression. Increased risk of depression d/w patient in view of her past history.  Normotensive, afebrile. Doing well on Aldomet, no change of anti-hypertensive required for now.    A:  S/p vaginal delivery post-partum day 2       Gestational diabetes, uncomplicated.       Essential hypertension and hypothyroidism, both well controlled.       Mild iron deficiency anemia.    P:  Patient discharged with prescriptions for ibuprofen and Norco.       Office follow up in 6 weeks.

## 2013-10-07 NOTE — Progress Notes (Signed)
Patient given and explained discharge instructions.  No questions at this time.  Patient Also given 2 prescriptions from Dr. Bobette Mo.

## 2013-10-07 NOTE — Lactation Note (Signed)
Spoke to mom this morning at length about breastfeeding goals and what the plan will be for discharge today.  Bili is up, and pediatrician wants the baby supplemented with formula to ensure that bili will come down.  Mom agreeable to formula supplementation via pace bottle feeding.  Discussed pumping at home with electric pump to bring in proper milk supply.  Pump is to be delivered today to pt's home. Pt knows to call after discharge with any questions or concerns.  Mom comfortable with plan for breastfeeding at home.

## 2013-10-12 NOTE — Progress Notes (Signed)
Christine Lynn @[facility] @ 10/12/2013  1155    1.  How is breastfeeding going so far-very good    2.  Are your breasts sore, swollen or reddened-no    3.  Any your nipples cracked, tender or bleeding-no    4.  Has your milk come in yet-yes    5.  Is your baby able to latch on without difficulty-yes    6.  Do your breasts feel full before feeding and softer after feeding-yes    7.  Do you hear swallowing while the baby nurses-yes    8.  Are you getting 8 or more feedings in 24 hours-yes    9.  # of wet diapers in 24 hours-6    10. # of stools in 24 hours-"too many to count"    11. Color of stool-    12.  Are you supplementing-no    Counseling-not using nipple shield any more        Out patient consult offered-                                        Date/Time-    Comments-  Rod CanLori Zyler Hyson

## 2014-01-14 LAB — COMPREHENSIVE METABOLIC PANEL
ALT: 11 U/L (ref 0–32)
AST: 18 U/L (ref 0–31)
Albumin: 3.9 g/dL (ref 3.5–5.2)
Alkaline Phosphatase: 61 U/L (ref 35–104)
Anion Gap: 11 mmol/L (ref 7–16)
BUN: 13 mg/dL (ref 6–20)
CO2: 27 mmol/L (ref 22–29)
Calcium: 9.5 mg/dL (ref 8.6–10.2)
Chloride: 102 mmol/L (ref 98–107)
Creatinine: 0.9 mg/dL (ref 0.5–1.0)
GFR African American: >60
GFR Non-African American: >60 mL/min/{1.73_m2} (ref >=60)
Glucose: 94 mg/dL (ref 74–109)
Potassium: 4.2 mmol/L (ref 3.5–5.0)
Sodium: 140 mmol/L (ref 132–146)
Total Bilirubin: 0.3 mg/dL (ref 0.0–1.2)
Total Protein: 7.5 g/dL (ref 6.4–8.3)

## 2014-01-14 LAB — TSH: TSH: 0.311 u[IU]/mL (ref 0.270–4.200)

## 2014-01-14 LAB — LIPID PANEL
Cholesterol, Total: 213 mg/dL — ABNORMAL HIGH (ref 0–199)
HDL: 62 mg/dL (ref >40)
LDL Calculated: 135 mg/dL — ABNORMAL HIGH (ref 0–99)
Triglycerides: 82 mg/dL (ref 0–149)
VLDL Cholesterol Calculated: 16 mg/dL

## 2014-01-14 LAB — CBC
Hematocrit: 37.3 % (ref 34.0–48.0)
Hemoglobin: 11.6 g/dL (ref 11.5–15.5)
MCH: 27.2 pg (ref 26.0–35.0)
MCHC: 31.2 % — ABNORMAL LOW (ref 32.0–34.5)
MCV: 87.5 fL (ref 80.0–99.9)
MPV: 8.7 fL (ref 7.0–12.0)
Platelets: 224 E9/L (ref 130–450)
RBC: 4.26 E12/L (ref 3.50–5.50)
RDW: 14.3 fL (ref 11.5–15.0)
WBC: 5.1 E9/L (ref 4.5–11.5)

## 2014-01-14 LAB — HEMOGLOBIN A1C: Hemoglobin A1C: 6.2 % — ABNORMAL HIGH (ref 4.8–5.9)

## 2014-03-26 ENCOUNTER — Encounter

## 2014-03-28 MED ORDER — DICLOFENAC POTASSIUM 50 MG PO TABS
50 MG | ORAL_TABLET | Freq: Three times a day (TID) | ORAL | Status: DC
Start: 2014-03-28 — End: 2015-08-10

## 2014-03-28 NOTE — Patient Instructions (Signed)
Starting a Weight Loss Plan: After Your Visit  Your Care Instructions  If you are thinking about losing weight, it can be hard to know where to start. Your doctor can help you set up a weight loss plan that best meets your needs. You may want to take a class on nutrition or exercise, or join a weight loss support group. If you have questions about how to make changes to your eating or exercise habits, ask your doctor about seeing a registered dietitian or an exercise specialist.  It can be a big challenge to lose weight. But you do not have to make huge changes at once. Make small changes, and stick with them. When those changes become habit, add a few more changes.  If you do not think you are ready to make changes right now, try to pick a date in the future. Make an appointment to see your doctor to discuss whether the time is right for you to start a plan.  Follow-up care is a key part of your treatment and safety. Be sure to make and go to all appointments, and call your doctor if you are having problems. It???s also a good idea to know your test results and keep a list of the medicines you take.  How can you care for yourself at home?  ?? Set realistic goals. Many people expect to lose much more weight than is likely. A weight loss of 5% to 10% of your body weight may be enough to improve your health.  ?? Get family and friends involved to provide support. Talk to them about why you are trying to lose weight, and ask them to help. They can help by participating in exercise and having meals with you, even if they may be eating something different.  ?? Find what works best for you. If you do not have time or do not like to cook, a program that offers meal replacement bars or shakes may be better for you. Or if you like to prepare meals, finding a plan that includes daily menus and recipes may be best.  ?? Ask your doctor about other health professionals who can help you achieve your weight loss goals.  ?? A dietitian  can help you make healthy changes in your diet.  ?? An exercise specialist or personal trainer can help you develop a safe and effective exercise program.  ?? A counselor or psychiatrist can help you cope with issues such as depression, anxiety, or family problems that can make it hard to focus on weight loss.  ?? Consider joining a support group for people who are trying to lose weight. Your doctor can suggest groups in your area.   Where can you learn more?   Go to [email or web address] and sign in to your MyChart account. Enter U357 in the Search Health Information box to learn more about ???Starting a Weight Loss Plan: After Your Visit.???    If you do not have an account, please click on the ???Sign Up Now??? link.     ?? 2006-2015 [employer]. Care instructions adapted under license by [facility]. This care instruction is for use with your licensed healthcare professional. If you have questions about a medical condition or this instruction, always ask your healthcare professional. [employer] disclaims any warranty or liability for your use of this information.  Content Version: 10.4.390249; Current as of: March 10, 2013

## 2014-03-28 NOTE — Progress Notes (Signed)
OFFICE PROGRESS NOTE      SUBJECTIVE:        Patient ID:   Christine Lynn is a 33 y.o. female who presents for follow up joint pains.  Chief Complaint   Patient presents with   ??? 1 Month Follow-Up   ??? Knee Pain     rt had xrays    ??? Shoulder Pain     rt had xrays           HPI:   Patient here for follow up for joint pains. Patient states right shoulder pain has improved, but still has severe pain in both wrists and both knees. Xray results reviewed. Pain is usually 10/10. Pain is constant. Patient has noticed swelling in both knees. Denies numbness/tingling in hands. Patient states pain gets worse with overusing joints but pain is still there but milder. Patient has not had arthritis labs done.  Patient no longer getting pain relief with taking mobic.      Patient has had increased appetite and thirst for past couple weeks.  Patient has been hiding to eat snacks. Denies purging. Lab results reviewed with patient from 1/15.    Prior to Admission medications    Medication Sig Start Date End Date Taking? Authorizing Provider   citalopram (CELEXA) 40 MG tablet 40 mg daily. 03/04/14  Yes Historical Provider, MD   LORazepam (ATIVAN) 0.5 MG tablet 0.5 mg as needed. 01/19/14  Yes Historical Provider, MD   MICROGESTIN FE 1/20 1-20 MG-MCG per tablet  02/06/14  Yes Historical Provider, MD   diclofenac (CATAFLAM) 50 MG tablet Take 1 tablet by mouth 3 times daily. 03/28/14  Yes Mare Ferrari, MD   levothyroxine (SYNTHROID) 100 MCG tablet Take 125 mcg by mouth Daily.   Yes Historical Provider, MD   lisinopril-hydrochlorothiazide (PRINZIDE;ZESTORETIC) 10-12.5 MG per tablet Take 1 tablet by mouth daily.   Yes Historical Provider, MD     History     Social History   ??? Marital Status: Single     Spouse Name: N/A     Number of Children: N/A   ??? Years of Education: N/A     Social History Main Topics   ??? Smoking status: Never Smoker    ??? Smokeless tobacco: Never Used   ??? Alcohol Use:  No   ??? Drug Use: No   ??? Sexual Activity:     Partners: Male     Other Topics Concern   ??? None     Social History Narrative       I have reviewed Inez's allergies, medications, problem list, medical, social and family history and have updated as needed in the electronic medical record???  Review Of Systems:    Skin: no abnormal pigmentation, rash, scaling, itching, masses, hair or nail changes  Ears/Nose/Throat: no hearing loss, tinnitus, vertigo, nosebleed, nasal congestion, rhinorrhea, sore throat  Respiratory: no cough, pleuritic chest pain, dyspnea, or wheezing  Cardiovascular: no chest pain, angina, dyspnea on exertion, orthopnea, PND, palpitations, or claudication  Gastrointestinal: no nausea, vomiting, heartburn, diarrhea, constipation, bloating,  abdominal pain, or blood per rectum.Appetite is good  Genitourinary: no urinary urgency, frequency, dysuria, nocturia, hesitancy, or incontinence  Musculoskeletal: joint pains off and on. Morning stiffness. Ambulating well  Neurologic: +intermittent headaches  Endocrine: +polyphagia    OBJECTIVE:     VS:  Wt Readings from Last 3 Encounters:   03/28/14 270 lb (122.471 kg)   10/04/13 282 lb (127.914 kg)   08/31/13 285 lb (129.275  kg)     Temp Readings from Last 3 Encounters:   10/07/13 98.4 ??F (36.9 ??C) Oral   09/24/13 98.9 ??F (37.2 ??C) Oral   09/17/13 97.8 ??F (36.6 ??C)      BP Readings from Last 3 Encounters:   03/28/14 112/70   10/07/13 118/88   09/24/13 122/67        General: well nourished, no acute distress  Neck: supple, no bruit, no thyromegaly  Heart: regular rate and rhythm, no murmur, rub, gallop  Lung: clear to auscultation bilaterally, no wheezing, rales or rhonchi  Abdomen: soft, nontender, nondistended, +bowel sounds  Extremities: no edema, normal pedal pulses. +bilateral knee crepitus with inferior knee tenderness      ASSESSMENT     Patient Active Problem List    Diagnosis Date Noted   ??? Depression 03/28/2014   ??? Polyarthritis 03/28/2014   ??? Abnormal  weight gain 03/28/2014   ??? Polyphagia 03/28/2014        Diagnosis:   1. Polyarthritis  ANA    RHEUMATOID FACTOR    diclofenac (CATAFLAM) 50 MG tablet   2. Depression  citalopram (CELEXA) 40 MG tablet    LORazepam (ATIVAN) 0.5 MG tablet   3. Abnormal weight gain     4. Polyphagia         PLAN:   Plan as above. Weight loss information provided.      Patient Instructions       Starting a Weight Loss Plan: After Your Visit  Your Care Instructions  If you are thinking about losing weight, it can be hard to know where to start. Your doctor can help you set up a weight loss plan that best meets your needs. You may want to take a class on nutrition or exercise, or join a weight loss support group. If you have questions about how to make changes to your eating or exercise habits, ask your doctor about seeing a registered dietitian or an exercise specialist.  It can be a big challenge to lose weight. But you do not have to make huge changes at once. Make small changes, and stick with them. When those changes become habit, add a few more changes.  If you do not think you are ready to make changes right now, try to pick a date in the future. Make an appointment to see your doctor to discuss whether the time is right for you to start a plan.  Follow-up care is a key part of your treatment and safety. Be sure to make and go to all appointments, and call your doctor if you are having problems. It???s also a good idea to know your test results and keep a list of the medicines you take.  How can you care for yourself at home?  ?? Set realistic goals. Many people expect to lose much more weight than is likely. A weight loss of 5% to 10% of your body weight may be enough to improve your health.  ?? Get family and friends involved to provide support. Talk to them about why you are trying to lose weight, and ask them to help. They can help by participating in exercise and having meals with you, even if they may be eating something  different.  ?? Find what works best for you. If you do not have time or do not like to cook, a program that offers meal replacement bars or shakes may be better for you. Or if you like to prepare meals,  finding a plan that includes daily menus and recipes may be best.  ?? Ask your doctor about other health professionals who can help you achieve your weight loss goals.  ?? A dietitian can help you make healthy changes in your diet.  ?? An exercise specialist or personal trainer can help you develop a safe and effective exercise program.  ?? A counselor or psychiatrist can help you cope with issues such as depression, anxiety, or family problems that can make it hard to focus on weight loss.  ?? Consider joining a support group for people who are trying to lose weight. Your doctor can suggest groups in your area.   Where can you learn more?   Go to [email or web address] and sign in to your MyChart account. Enter [id number] in the Search Health Information box to learn more about ???Starting a Weight Loss Plan: After Your Visit.???    If you do not have an account, please click on the ???Sign Up Now??? link.     ?? 2006-2015 [employer]. Care instructions adapted under license by [facility]. This care instruction is for use with your licensed healthcare professional. If you have questions about a medical condition or this instruction, always ask your healthcare professional. [employer], Inc[employer] disclaims any warranty or liability for your use of this information.  Content Version: 10.4.390249; Current as of: March 10, 2013                    Return in about 2 months (around 05/28/2014) for medical follow up.         I have reviewed my findings and recommendations with Lavenia A Caligiuri.    Mare FerrariFareedah Z Goodwin-Capers, M.D

## 2014-07-18 MED ORDER — LISINOPRIL-HYDROCHLOROTHIAZIDE 10-12.5 MG PO TABS
ORAL_TABLET | Freq: Every day | ORAL | Status: DC
Start: 2014-07-18 — End: 2014-09-22

## 2014-08-04 NOTE — Telephone Encounter (Signed)
PT NEEDS A REFILL ON SYNTHYROID 0.125MCG, TAKES ONCE DAILY. [facility] [address] [phone]. PT HAS APPT AUG 20TH

## 2014-08-08 MED ORDER — LEVOTHYROXINE SODIUM 125 MCG PO TABS
125 MCG | ORAL_TABLET | Freq: Every day | ORAL | Status: DC
Start: 2014-08-08 — End: 2014-09-22

## 2014-09-22 MED ORDER — CITALOPRAM HYDROBROMIDE 40 MG PO TABS
40 MG | ORAL_TABLET | Freq: Every day | ORAL | Status: DC
Start: 2014-09-22 — End: 2015-02-23

## 2014-09-22 MED ORDER — LEVOTHYROXINE SODIUM 125 MCG PO TABS
125 MCG | ORAL_TABLET | Freq: Every day | ORAL | Status: DC
Start: 2014-09-22 — End: 2014-11-23

## 2014-09-22 MED ORDER — LISINOPRIL-HYDROCHLOROTHIAZIDE 10-12.5 MG PO TABS
ORAL_TABLET | Freq: Every day | ORAL | Status: DC
Start: 2014-09-22 — End: 2015-02-23

## 2014-09-22 MED ORDER — AMLODIPINE BESYLATE 5 MG PO TABS
5 MG | ORAL_TABLET | Freq: Every day | ORAL | Status: DC
Start: 2014-09-22 — End: 2014-11-23

## 2014-09-22 MED ORDER — LORAZEPAM 0.5 MG PO TABS
0.5 MG | ORAL_TABLET | Freq: Two times a day (BID) | ORAL | Status: DC | PRN
Start: 2014-09-22 — End: 2015-02-23

## 2014-09-22 NOTE — Progress Notes (Signed)
OFFICE PROGRESS NOTE      SUBJECTIVE:        Patient ID:   Christine Lynn is a 33 y.o. female who presents for follow up for hypertension, anxiety, and thyroid.   Chief Complaint   Patient presents with   ??? Hypertension     b/p staying up   ??? Anxiety   ??? Medication Refill       HPI:     Patient states diastolic blood pressure has been in 90-100's recently. Patient doing well on current regimen for hypertension.    Patient doing well on current regimen for anxiety and depression. Patient takes Celexa daily but not needing lorazepam often.    Patient doing well on current regimen for thyroid. Patient has had weight gain despite taking Synthroid.      Prior to Admission medications    Medication Sig Start Date End Date Taking? Authorizing Provider   clotrimazole-betamethasone (LOTRISONE) 1-0.05 % cream  09/15/14  Yes Historical Provider, MD   ciclopirox (PENLAC) 8 % solution  06/30/14  Yes Historical Provider, MD   lisinopril-hydrochlorothiazide (PRINZIDE;ZESTORETIC) 10-12.5 MG per tablet Take 1 tablet by mouth daily 09/22/14  Yes Mare Ferrari, MD   amLODIPine (NORVASC) 5 MG tablet Take 1 tablet by mouth daily 09/22/14  Yes Mare Ferrari, MD   citalopram (CELEXA) 40 MG tablet Take 1 tablet by mouth daily 09/22/14  Yes Mare Ferrari, MD   LORazepam (ATIVAN) 0.5 MG tablet Take 1 tablet by mouth 2 times daily as needed 09/22/14  Yes Mare Ferrari, MD   levothyroxine (SYNTHROID) 125 MCG tablet Take 1 tablet by mouth daily 09/22/14  Yes Mare Ferrari, MD   MICROGESTIN FE 1/20 1-20 MG-MCG per tablet  02/06/14  Yes Historical Provider, MD   diclofenac (CATAFLAM) 50 MG tablet Take 1 tablet by mouth 3 times daily. 03/28/14  Yes Mare Ferrari, MD     History     Social History   ??? Marital Status: Single     Spouse Name: N/A     Number of Children: N/A   ??? Years of Education: N/A     Social History Main Topics   ??? Smoking  status: Never Smoker    ??? Smokeless tobacco: Never Used   ??? Alcohol Use: No   ??? Drug Use: No   ??? Sexual Activity:     Partners: Male     Other Topics Concern   ??? None     Social History Narrative       I have reviewed Evelynne's allergies, medications, problem list, medical, social and family history and have updated as needed in the electronic medical record???  Review Of Systems:    Skin: no abnormal pigmentation, rash, scaling, itching, masses, +hair shedding and thinning, no nail changes  Ears/Nose/Throat: no hearing loss, tinnitus, vertigo, nosebleed, nasal congestion, rhinorrhea, sore throat  Respiratory: no cough, pleuritic chest pain, dyspnea, or wheezing  Cardiovascular: no chest pain, angina, dyspnea on exertion, orthopnea, PND, palpitations, or claudication  Gastrointestinal: no nausea, vomiting, heartburn, diarrhea, constipation, bloating,  abdominal pain, or blood per rectum.Appetite is good  Genitourinary: no urinary urgency, frequency, dysuria, nocturia, hesitancy, or incontinence  Neurologic: +occas. headaches        OBJECTIVE:     VS:  Wt Readings from Last 3 Encounters:   09/22/14 267 lb (121.11 kg)   03/28/14 270 lb (122.471 kg)   10/04/13 282 lb (127.914 kg)  Filed Vitals:    09/22/14 0836   BP: 134/94   Pulse: 80   Resp: 20         General: well nourished, no acute distress  Neck: supple, no bruit, no thyromegaly  Heart: regular rate and rhythm, no murmur, rub, gallop  Lung: clear to auscultation bilaterally, no wheezing, rales or rhonchi  Abdomen: soft, nontender, nondistended, +bowel sounds  Extremities: no edema, normal pedal pulses          ASSESSMENT/PLAN   [place] was seen today for hypertension, anxiety and medication refill.    Diagnoses and associated orders for this visit:    Essential hypertension  - lisinopril-hydrochlorothiazide (PRINZIDE;ZESTORETIC) 10-12.5 MG per tablet; Take 1 tablet by mouth daily  - amLODIPine (NORVASC) 5 MG tablet; Take 1 tablet by mouth  daily  - Lipid Panel; Future  - Comprehensive Metabolic Panel; Future  - CBC; Future    Depression  - citalopram (CELEXA) 40 MG tablet; Take 1 tablet by mouth daily  - LORazepam (ATIVAN) 0.5 MG tablet; Take 1 tablet by mouth 2 times daily as needed    Hypothyroidism (acquired)  - TSH without Reflex; Future  - levothyroxine (SYNTHROID) 125 MCG tablet; Take 1 tablet by mouth daily    Morbid obesity with BMI of 40.0-44.9, adult (HCC)        Phone/MyChart follow up if tests abnormal.        Return in about 2 months (around 11/22/2014) for follow up hypertension.         I have reviewed my findings and recommendations with Seferina A Buda.    Mare Ferrari, M.D

## 2014-09-22 NOTE — Patient Instructions (Signed)
Starting a Weight Loss Plan: Care Instructions  Your Care Instructions  If you are thinking about losing weight, it can be hard to know where to start. Your doctor can help you set up a weight loss plan that best meets your needs. You may want to take a class on nutrition or exercise, or join a weight loss support group. If you have questions about how to make changes to your eating or exercise habits, ask your doctor about seeing a registered dietitian or an exercise specialist.  It can be a big challenge to lose weight. But you do not have to make huge changes at once. Make small changes, and stick with them. When those changes become habit, add a few more changes.  If you do not think you are ready to make changes right now, try to pick a date in the future. Make an appointment to see your doctor to discuss whether the time is right for you to start a plan.  Follow-up care is a key part of your treatment and safety. Be sure to make and go to all appointments, and call your doctor if you are having problems. It???s also a good idea to know your test results and keep a list of the medicines you take.  How can you care for yourself at home?  ?? Set realistic goals. Many people expect to lose much more weight than is likely. A weight loss of 5% to 10% of your body weight may be enough to improve your health.  ?? Get family and friends involved to provide support. Talk to them about why you are trying to lose weight, and ask them to help. They can help by participating in exercise and having meals with you, even if they may be eating something different.  ?? Find what works best for you. If you do not have time or do not like to cook, a program that offers meal replacement bars or shakes may be better for you. Or if you like to prepare meals, finding a plan that includes daily menus and recipes may be best.  ?? Ask your doctor about other health professionals who can help you achieve your weight loss goals.  ?? A dietitian  can help you make healthy changes in your diet.  ?? An exercise specialist or personal trainer can help you develop a safe and effective exercise program.  ?? A counselor or psychiatrist can help you cope with issues such as depression, anxiety, or family problems that can make it hard to focus on weight loss.  ?? Consider joining a support group for people who are trying to lose weight. Your doctor can suggest groups in your area.   Where can you learn more?   Go to [email or web address] and sign in to your MyChart account. Enter [phone] in the [place] box to learn more about ???Starting a Weight Loss Plan: Care Instructions.???    If you do not have an account, please click on the ???Sign Up Now??? link.     ?? 2006-2015 [employer], [employer]. Care instructions adapted under license by [facility]. This care instruction is for use with your licensed healthcare professional. If you have questions about a medical condition or this instruction, always ask your healthcare professional. [place] any warranty or liability for your use of this information.  Content Version: 10.6.465758; Current as of: February 18, 2014              High Blood Pressure:  Care Instructions  Your Care Instructions  If your blood pressure is usually above 140/90, you have high blood pressure, or hypertension. Despite what a lot of people think, high blood pressure usually doesn't cause headaches or make you feel dizzy or lightheaded. It usually has no symptoms. But it does increase your risk for heart attack, stroke, and kidney or eye damage. The higher your blood pressure, the more your risk increases.  Your doctor will give you a goal for your blood pressure. Your goal will be based on your health and your age. An example of a goal is to keep your blood pressure below 140/90.  Lifestyle changes, such as eating healthy and being active, are always important to help lower blood pressure. You  might also take medicine to reach your blood pressure goal.  Follow-up care is a key part of your treatment and safety. Be sure to make and go to all appointments, and call your doctor if you are having problems. It's also a good idea to know your test results and keep a list of the medicines you take.  How can you care for yourself at home?  Medical treatment  ?? If you stop taking your medicine, your blood pressure will go back up. You may take one or more types of medicine to lower your blood pressure. Be safe with medicines. Take your medicine exactly as prescribed. Call your doctor if you think you are having a problem with your medicine.  ?? Your doctor may suggest that you take one low-dose aspirin (81 mg) a day. This can help reduce your risk of having a stroke or heart attack.  ?? See your doctor regularly. You may need to see the doctor more often at first or until your blood pressure comes down.  ?? If you are taking blood pressure medicine, talk to your doctor before you take decongestants or anti-inflammatory medicine, such as ibuprofen. Some of these medicines can raise blood pressure.  ?? Learn how to check your blood pressure at home.  Lifestyle changes  ?? Stay at a healthy weight. This is especially important if you put on weight around the waist. Losing even 10 pounds can help you lower your blood pressure.  ?? If your doctor recommends it, get more exercise. Walking is a good choice. Bit by bit, increase the amount you walk every day. Try for at least 30 minutes on most days of the week. You also may want to swim, bike, or do other activities.  ?? Avoid or limit alcohol. Talk to your doctor about whether you can drink any alcohol.  ?? Try to limit how much sodium you eat to less than 2,300 milligrams (mg) a day. Your doctor may ask you to try to eat less than 1,500 mg a day.  ?? Eat plenty of fruits (such as bananas and oranges), vegetables, legumes, whole grains, and low-fat dairy products.  ?? Lower the  amount of saturated fat in your diet. Saturated fat is found in animal products such as milk, cheese, and meat. Limiting these foods may help you lose weight and also lower your risk for heart disease.  ?? Do not smoke. Smoking increases your risk for heart attack and stroke. If you need help quitting, talk to your doctor about stop-smoking programs and medicines. These can increase your chances of quitting for good.  When should you call for help?  Call your doctor now or seek immediate medical care if:  ?? Your blood pressure is  much higher than normal (such as 180/110 or higher).  ?? You think high blood pressure is causing symptoms such as:  ?? Severe headache.  ?? Blurry vision.  Watch closely for changes in your health, and be sure to contact your doctor if:  ?? You do not get better as expected.   Where can you learn more?   Go to [email or web address] and sign in to your MyChart account. Enter [phone] in the [place] box to learn more about ???High Blood Pressure: Care Instructions.???    If you do not have an account, please click on the ???Sign Up Now??? link.     ?? 2006-2015 Health[employer], Inc[employer]. Care instructions adapted under license by [facility]. This care instruction is for use with your licensed healthcare professional. If you have questions about a medical condition or this instruction, always ask your healthcare professional. [place] any warranty or liability for your use of this information.  Content Version: 10.6.465758; Current as of: February 18, 2014

## 2014-10-13 LAB — COMPREHENSIVE METABOLIC PANEL
ALT: 29 U/L (ref 0–32)
AST: 19 U/L (ref 0–31)
Albumin: 3.9 g/dL (ref 3.5–5.2)
Alkaline Phosphatase: 59 U/L (ref 35–104)
Anion Gap: 10 mmol/L (ref 7–16)
BUN: 14 mg/dL (ref 6–20)
CO2: 27 mmol/L (ref 22–29)
Calcium: 9.2 mg/dL (ref 8.6–10.2)
Chloride: 102 mmol/L (ref 98–107)
Creatinine: 0.9 mg/dL (ref 0.5–1.0)
GFR African American: >60
GFR Non-African American: >60 mL/min/{1.73_m2} (ref >=60)
Glucose: 97 mg/dL (ref 74–109)
Potassium: 4.1 mmol/L (ref 3.5–5.0)
Sodium: 139 mmol/L (ref 132–146)
Total Bilirubin: 0.3 mg/dL (ref 0.0–1.2)
Total Protein: 7.3 g/dL (ref 6.4–8.3)

## 2014-10-13 LAB — CBC
Hematocrit: 35.2 % (ref 34.0–48.0)
Hemoglobin: 11.6 g/dL (ref 11.5–15.5)
MCH: 28.8 pg (ref 26.0–35.0)
MCHC: 32.8 % (ref 32.0–34.5)
MCV: 87.8 fL (ref 80.0–99.9)
MPV: 9.6 fL (ref 7.0–12.0)
Platelets: 243 E9/L (ref 130–450)
RBC: 4 E12/L (ref 3.50–5.50)
RDW: 13.8 fL (ref 11.5–15.0)
WBC: 7.8 E9/L (ref 4.5–11.5)

## 2014-10-13 LAB — LIPID PANEL
Cholesterol, Total: 192 mg/dL (ref 0–199)
HDL: 56 mg/dL (ref >40)
LDL Calculated: 123 mg/dL — ABNORMAL HIGH (ref 0–99)
Triglycerides: 64 mg/dL (ref 0–149)
VLDL Cholesterol Calculated: 13 mg/dL

## 2014-10-13 LAB — TSH: TSH: 0.513 u[IU]/mL (ref 0.270–4.200)

## 2014-11-23 ENCOUNTER — Ambulatory Visit
Admit: 2014-11-23 | Discharge: 2014-11-23 | Payer: PRIVATE HEALTH INSURANCE | Attending: Family Medicine | Primary: Family Medicine

## 2014-11-23 DIAGNOSIS — I10 Essential (primary) hypertension: Secondary | ICD-10-CM

## 2014-11-23 MED ORDER — AMLODIPINE BESYLATE 5 MG PO TABS
5 MG | ORAL_TABLET | Freq: Every day | ORAL | Status: DC
Start: 2014-11-23 — End: 2015-02-23

## 2014-11-23 MED ORDER — LEVOTHYROXINE SODIUM 125 MCG PO TABS
125 MCG | ORAL_TABLET | Freq: Every day | ORAL | Status: DC
Start: 2014-11-23 — End: 2015-02-23

## 2014-11-23 MED ORDER — NAPROXEN 500 MG PO TABS
500 MG | ORAL_TABLET | Freq: Two times a day (BID) | ORAL | Status: DC
Start: 2014-11-23 — End: 2015-02-23

## 2014-11-23 MED ORDER — METFORMIN HCL 500 MG PO TABS
500 MG | ORAL_TABLET | Freq: Two times a day (BID) | ORAL | Status: DC
Start: 2014-11-23 — End: 2015-02-23

## 2014-11-23 NOTE — Patient Instructions (Signed)
Starting a Weight Loss Plan: Care Instructions  Your Care Instructions  If you are thinking about losing weight, it can be hard to know where to start. Your doctor can help you set up a weight loss plan that best meets your needs. You may want to take a class on nutrition or exercise, or join a weight loss support group. If you have questions about how to make changes to your eating or exercise habits, ask your doctor about seeing a registered dietitian or an exercise specialist.  It can be a big challenge to lose weight. But you do not have to make huge changes at once. Make small changes, and stick with them. When those changes become habit, add a few more changes.  If you do not think you are ready to make changes right now, try to pick a date in the future. Make an appointment to see your doctor to discuss whether the time is right for you to start a plan.  Follow-up care is a key part of your treatment and safety. Be sure to make and go to all appointments, and call your doctor if you are having problems. It???s also a good idea to know your test results and keep a list of the medicines you take.  How can you care for yourself at home?  ?? Set realistic goals. Many people expect to lose much more weight than is likely. A weight loss of 5% to 10% of your body weight may be enough to improve your health.  ?? Get family and friends involved to provide support. Talk to them about why you are trying to lose weight, and ask them to help. They can help by participating in exercise and having meals with you, even if they may be eating something different.  ?? Find what works best for you. If you do not have time or do not like to cook, a program that offers meal replacement bars or shakes may be better for you. Or if you like to prepare meals, finding a plan that includes daily menus and recipes may be best.  ?? Ask your doctor about other health professionals who can help you achieve your weight loss goals.  ?? A dietitian  can help you make healthy changes in your diet.  ?? An exercise specialist or personal trainer can help you develop a safe and effective exercise program.  ?? A counselor or psychiatrist can help you cope with issues such as depression, anxiety, or family problems that can make it hard to focus on weight loss.  ?? Consider joining a support group for people who are trying to lose weight. Your doctor can suggest groups in your area.   Where can you learn more?   Go to [email or web address] and sign in to your MyChart account. Enter [id number] in the Search Health Information box to learn more about ???Starting a Weight Loss Plan: Care Instructions.???    If you do not have an account, please click on the ???Sign Up Now??? link.     ?? 2006-2015 Healthwise, Inc[employer]. Care instructions adapted under license by [facility]. This care instruction is for use with your licensed healthcare professional. If you have questions about a medical condition or this instruction, always ask your healthcare professional. Health[employer], [employer] disclaims any warranty or liability for your use of this information.  Content Version: 10.6.465758; Current as of: February 18, 2014

## 2014-11-23 NOTE — Progress Notes (Signed)
Chief Complaint   Patient presents with   ??? Hypertension         Hypertension:  Patient is here for follow up chronic hypertension.  This is  generally controlled on current medication regimen.  BP today is 124/88.  Takes meds as directed and tolerates them well.  Most recent labs reviewed with patient and are not remarkable.  No symptoms from htn standpoint per ROS.  Patient is  compliant with lifestyle modifications.  Patient does not smoke.  Comorbid conditions include obesity and knee pain.      Patient states knee pain has worsened. Patient takes Motrin prn without relief. Pain is usually 9/10.  +swelling and popping. Denies buckling. +stiffness    Patient having difficulty losing weight.  Patient has been exercising regularly.      Patient's past medical, surgical, social and/or family history reviewed, updated in chart, and are non-contributory (unless otherwise stated).  Medications and allergies also reviewed and updated in chart.      BP 124/88 mmHg   Pulse 76   Resp 20   Ht 5\' 7"  (1.702 m)   Wt 269 lb (122.018 kg)   BMI 42.12 kg/m2   LMP 09/24/2014    Review of Systems   Respiratory: Negative for shortness of breath.    Cardiovascular: Negative for chest pain and leg swelling.   Musculoskeletal: Positive for arthralgias.       Physical Exam   Constitutional: She is oriented to person, place, and time. She appears well-developed and well-nourished. No distress.   Neck: Neck supple.   Cardiovascular: Normal rate, regular rhythm and normal heart sounds.  Exam reveals no gallop.    No murmur heard.  Pulmonary/Chest: Effort normal and breath sounds normal.   Abdominal: Soft. Bowel sounds are normal. She exhibits no distension. There is no tenderness.   Musculoskeletal: She exhibits no edema.   Neurological: She is alert and oriented to person, place, and time.   Skin: Skin is warm and dry.   Vitals reviewed.    Results for orders placed during the hospital encounter of 10/13/14   TSH WITHOUT REFLEX        Result Value Ref Range    TSH 0.513  0.270 - 4.200 uIU/mL   LIPID PANEL       Result Value Ref Range    Cholesterol, Total 192  0 - 199 mg/dL    Triglycerides 64  0 - 149 mg/dL    HDL 56  >54>40 mg/dL    LDL Calculated 098123 (*) 0 - 99 mg/dL    VLDL CHOLESTEROL CALCULATED 13     COMPREHENSIVE METABOLIC PANEL       Result Value Ref Range    Sodium 139  132 - 146 mmol/L    Potassium 4.1  3.5 - 5.0 mmol/L    Chloride 102  98 - 107 mmol/L    CO2 27  22 - 29 mmol/L    Anion Gap 10  7 - 16 mmol/L    Glucose 97  74 - 109 mg/dL    BUN 14  6 - 20 mg/dL    CREATININE 0.9  0.5 - 1.0 mg/dL    GFR Non-African American >60  >=60 mL/min/1.73    GFR African American >60      Calcium 9.2  8.6 - 10.2 mg/dL    Total Protein 7.3  6.4 - 8.3 g/dL    Alb 3.9  3.5 - 5.2 g/dL    Total Bilirubin  0.3  0.0 - 1.2 mg/dL    Alkaline Phosphatase 59  35 - 104 U/L    ALT 29  0 - 32 U/L    AST 19  0 - 31 U/L   CBC       Result Value Ref Range    WBC 7.8  4.5 - 11.5 E9/L    RBC 4.00  3.50 - 5.50 E12/L    Hemoglobin 11.6  11.5 - 15.5 g/dL    Hematocrit 16.135.2  09.634.0 - 48.0 %    MCV 87.8  80.0 - 99.9 fL    MCH 28.8  26.0 - 35.0 pg    MCHC 32.8  32.0 - 34.5 %    RDW 13.8  11.5 - 15.0 fL    Platelets 243  130 - 450 E9/L    MPV 9.6  7.0 - 12.0 fL       Christine Lynn was seen today for hypertension.    Diagnoses and associated orders for this visit:    Essential hypertension  - amLODIPine (NORVASC) 5 MG tablet; Take 1 tablet by mouth daily    Hypothyroidism (acquired)  - levothyroxine (SYNTHROID) 125 MCG tablet; Take 1 tablet by mouth daily    Abnormal weight gain  - metFORMIN (GLUCOPHAGE) 500 MG tablet; Take 1 tablet by mouth 2 times daily (with meals)    Bilateral knee pain  - naproxen (NAPROSYN) 500 MG tablet; Take 1 tablet by mouth 2 times daily (with meals)          Return in about 3 months (around 02/23/2015) for follow up hypertension., or sooner if necessary.      Educational materials and/or home exercises printed for patient's review and were included in patient  instructions on his/her After Visit Summary and given to patient at the end of visit.      Counseled regarding above diagnosis, including possible risks and complications,  especially if left uncontrolled.    Counseled regarding the possible side effects, risks, benefits and alternatives to treatment; patient and/or guardian verbalizes understanding, agrees, feels comfortable with and wishes to proceed with above treatment plan.    Advised patient to call with any new medication issues, and read all Rx info from pharmacy to assure aware of all possible risks and side effects of medication before taking.    Reviewed age and gender appropriate health screening exams and vaccinations.  Advised patient regarding importance of keeping up with recommended health maintenance and to schedule as soon as possible if overdue, as this is important in assessing for undiagnosed pathology, especially cancer, as well as protecting against potentially harmful/life threatening disease.        Patient and/or guardian verbalizes understanding and agrees with above counseling, assessment and plan.    All questions answered.

## 2015-01-24 ENCOUNTER — Telehealth

## 2015-01-24 NOTE — Telephone Encounter (Signed)
Patient called in and stated that her Gyn suggested she see an endocrinologist because her thyroid feels big. Patient has had lab work done and everything is showing as normal. Patient would like to be referred to Dr. Lelon MastManivel Eswaran. His phone number is [phone]

## 2015-01-26 NOTE — Telephone Encounter (Signed)
Please let patient know that I will need to order a thyroid ultrasound before setting up any referral for this. She has not had a thyroid ultrasound in 3 years.

## 2015-01-26 NOTE — Telephone Encounter (Signed)
CALLED PT LM TO CALL OFFICE

## 2015-02-02 ENCOUNTER — Encounter: Primary: Family Medicine

## 2015-02-02 ENCOUNTER — Inpatient Hospital Stay: Attending: Family Medicine | Primary: Family Medicine

## 2015-02-02 ENCOUNTER — Ambulatory Visit: Admit: 2015-02-02 | Primary: Family Medicine

## 2015-02-02 DIAGNOSIS — E042 Nontoxic multinodular goiter: Secondary | ICD-10-CM

## 2015-02-08 ENCOUNTER — Encounter

## 2015-02-08 NOTE — Progress Notes (Signed)
Quick Note:    Message left with patient that referral to ENT will be needed for this  ______

## 2015-02-23 ENCOUNTER — Ambulatory Visit
Admit: 2015-02-23 | Discharge: 2015-02-23 | Payer: PRIVATE HEALTH INSURANCE | Attending: Family Medicine | Primary: Family Medicine

## 2015-02-23 DIAGNOSIS — I10 Essential (primary) hypertension: Secondary | ICD-10-CM

## 2015-02-23 MED ORDER — NAPROXEN 500 MG PO TABS
500 MG | ORAL_TABLET | Freq: Two times a day (BID) | ORAL | Status: DC
Start: 2015-02-23 — End: 2015-08-10

## 2015-02-23 MED ORDER — CITALOPRAM HYDROBROMIDE 40 MG PO TABS
40 MG | ORAL_TABLET | Freq: Every day | ORAL | Status: DC
Start: 2015-02-23 — End: 2015-08-10

## 2015-02-23 MED ORDER — LEVOTHYROXINE SODIUM 125 MCG PO TABS
125 MCG | ORAL_TABLET | Freq: Every day | ORAL | Status: DC
Start: 2015-02-23 — End: 2015-08-10

## 2015-02-23 MED ORDER — PHENTERMINE HCL 37.5 MG PO TABS
37.5 MG | ORAL_TABLET | Freq: Every day | ORAL | Status: DC
Start: 2015-02-23 — End: 2015-03-23

## 2015-02-23 MED ORDER — AMLODIPINE BESYLATE 5 MG PO TABS
5 MG | ORAL_TABLET | Freq: Every day | ORAL | Status: DC
Start: 2015-02-23 — End: 2015-08-10

## 2015-02-23 MED ORDER — LORAZEPAM 0.5 MG PO TABS
0.5 MG | ORAL_TABLET | Freq: Two times a day (BID) | ORAL | Status: DC | PRN
Start: 2015-02-23 — End: 2015-08-10

## 2015-02-23 MED ORDER — LISINOPRIL-HYDROCHLOROTHIAZIDE 10-12.5 MG PO TABS
ORAL_TABLET | Freq: Every day | ORAL | Status: DC
Start: 2015-02-23 — End: 2015-08-10

## 2015-02-23 NOTE — Progress Notes (Signed)
[facility]   [address] [place], [address]   [place], [place] [id number]   [phone]   Kandis BanFareedah Goodwin-Capers, MD     Patient: Christine LeysLinnise A Lynn  Date of Birth: 25-Sep-1981  Visit Date: 02/23/15    [name] is a 34 y.o. year old female here today for hypertension, hypothyroidism, depression/anxiety and weight gain.   Chief Complaint   Patient presents with   ??? Hypertension       HPI  Patient doing well on current regimen for hypertension.    Patient doing well on current regimen for thyroid.     Patient could not tolerate metformin for weight loss. Patient wants to try something else to help lose weight.    Patient doing well on current regimen for anxiety/depression.    Review of Systems   Constitutional: Positive for fatigue.   HENT: Positive for trouble swallowing (tightness in throat from thyroid nodule).    Eyes: Negative for visual disturbance.   Respiratory: Negative for cough, shortness of breath and wheezing.    Cardiovascular: Negative for chest pain, palpitations and leg swelling.   Gastrointestinal: Negative for nausea, vomiting and diarrhea.   Genitourinary: Negative for dysuria, frequency and difficulty urinating.   Skin: Negative for rash.       Past medical, surgical, social and/or family history reviewed, updated as needed, and are non-contributory (unless otherwise stated).  Medications, allergies, and problem list also reviewed and updated as needed in patient's record.    Wt Readings from Last 3 Encounters:   02/23/15 275 lb (124.739 kg)   11/23/14 269 lb (122.018 kg)   09/22/14 267 lb (121.11 kg)                   Filed Vitals:    02/23/15 1009   BP: 122/80   Pulse: 76   Resp: 20       Physical Exam   Constitutional: She is oriented to person, place, and time. She appears well-developed and well-nourished.   Neck: Neck supple. Thyromegaly present.   Cardiovascular: Normal rate, regular rhythm and normal heart sounds.  Exam reveals no gallop.    No murmur heard.  Pulmonary/Chest:  Effort normal and breath sounds normal. She has no wheezes. She has no rales.   Abdominal: Soft. Bowel sounds are normal. She exhibits no distension. There is no tenderness.   Musculoskeletal: She exhibits no edema.   Neurological: She is alert and oriented to person, place, and time.   Skin: Skin is warm and dry.   Psychiatric: She has a normal mood and affect.   Vitals reviewed.      ASSESSMENT/PLAN  Christine Lynn was seen today for hypertension.    Diagnoses and associated orders for this visit:    Essential hypertension  - amLODIPine (NORVASC) 5 MG tablet; Take 1 tablet by mouth daily  - lisinopril-hydrochlorothiazide (PRINZIDE;ZESTORETIC) 10-12.5 MG per tablet; Take 1 tablet by mouth daily    Hypothyroidism (acquired)  - levothyroxine (SYNTHROID) 125 MCG tablet; Take 1 tablet by mouth daily    Bilateral knee pain  - naproxen (NAPROSYN) 500 MG tablet; Take 1 tablet by mouth 2 times daily (with meals)    Depression  - citalopram (CELEXA) 40 MG tablet; Take 1 tablet by mouth daily  - LORazepam (ATIVAN) 0.5 MG tablet; Take 1 tablet by mouth 2 times daily as needed    Abnormal weight gain  - phentermine (ADIPEX-P) 37.5 MG tablet; Take 1 tablet by mouth every morning (before  breakfast)      Controlled Substances Monitoring: Attestation: The Prescription Monitoring Report for this patient was reviewed today. Mare Ferrari, MD)  Documentation: Possible medication side effects, risk of tolerance and/or dependence, and alternative treatments discussed, No signs of potential drug abuse or diversion identified. Mare Ferrari, MD)      Phone/MyChart follow up if tests abnormal.    Return in about 1 month (around 03/24/2015) for weight management. or sooner if necessary.    I have reviewed my findings and recommendations with Christine Lynn.     Mare Ferrari, M.D

## 2015-03-22 ENCOUNTER — Inpatient Hospital Stay: Admit: 2015-03-22 | Attending: Otolaryngology/Facial Plastic Surgery | Primary: Family Medicine

## 2015-03-22 ENCOUNTER — Encounter

## 2015-03-22 DIAGNOSIS — E041 Nontoxic single thyroid nodule: Secondary | ICD-10-CM

## 2015-03-23 ENCOUNTER — Ambulatory Visit
Admit: 2015-03-23 | Discharge: 2015-03-23 | Payer: PRIVATE HEALTH INSURANCE | Attending: Family Medicine | Primary: Family Medicine

## 2015-03-23 DIAGNOSIS — R635 Abnormal weight gain: Secondary | ICD-10-CM

## 2015-03-23 MED ORDER — PHENTERMINE HCL 37.5 MG PO TABS
37.5 MG | ORAL_TABLET | Freq: Every day | ORAL | Status: AC
Start: 2015-03-23 — End: 2015-04-22

## 2015-03-23 NOTE — Progress Notes (Signed)
[facility]   [address] [place], [address]   [place], [place] [id number]   [phone]   Kandis BanFareedah Goodwin-Capers, MD     Patient: Christine Lynn  Date of Birth: 04/16/1981  Visit Date: 03/23/15    [name] is a 34 y.o. year old female here today for follow up for weight management.  Chief Complaint   Patient presents with   ??? Weight Management   ??? Medication Refill       HPI  Patient having difficulty losing weight. Patient has been tolerating Adipex for weight loss. Patient denies side effects to medication. Patient has noticed decrease in appetite.    Review of Systems   Eyes: Negative for visual disturbance.   Respiratory: Negative for shortness of breath.    Cardiovascular: Negative for chest pain, palpitations and leg swelling.   Gastrointestinal: Negative for nausea, vomiting and diarrhea.   Genitourinary: Negative for dysuria, frequency and difficulty urinating.   Skin: Negative for rash.   Neurological: Negative for tremors.       Past medical, surgical, social and/or family history reviewed, updated as needed, and are non-contributory (unless otherwise stated).  Medications, allergies, and problem list also reviewed and updated as needed in patient's record.    Wt Readings from Last 3 Encounters:   03/23/15 274 lb (124.286 kg)   02/23/15 275 lb (124.739 kg)   11/23/14 269 lb (122.018 kg)                   Filed Vitals:    03/23/15 1122   BP: 122/90   Pulse: 76   Resp: 20       Physical Exam   Constitutional: She is oriented to person, place, and time. She appears well-developed and well-nourished.   Neck: Neck supple.   Cardiovascular: Normal rate, regular rhythm and normal heart sounds.  Exam reveals no gallop.    No murmur heard.  Pulmonary/Chest: Effort normal and breath sounds normal. She has no wheezes. She has no rales.   Abdominal: Soft. Bowel sounds are normal. She exhibits no distension. There is no tenderness.   Musculoskeletal: She exhibits no edema.   Neurological: She is alert  and oriented to person, place, and time.   Skin: Skin is warm and dry.   Psychiatric: She has a normal mood and affect.   Vitals reviewed.      ASSESSMENT/PLAN  Christine Lynn was seen today for weight management and medication refill.    Diagnoses and associated orders for this visit:    Abnormal weight gain  - phentermine (ADIPEX-P) 37.5 MG tablet; Take 1 tablet by mouth every morning (before breakfast)          Phone/MyChart follow up if tests abnormal.    Return in about 1 month (around 04/23/2015) for weight management. or sooner if necessary.    I have reviewed my findings and recommendations with Christine Lynn.     Mare FerrariFareedah Z Goodwin-Capers, M.D

## 2015-04-25 ENCOUNTER — Encounter: Attending: Family Medicine | Primary: Family Medicine

## 2015-08-07 NOTE — Telephone Encounter (Signed)
NINA  CALLED FROM DR PATIBIDRI    PT IS HAVING SURGERY 09-21-15 NEEDS  SURGICAL CLEARANCE MUST KEEP APPT 08-10-15  OR WONT GET DONE

## 2015-08-10 ENCOUNTER — Ambulatory Visit
Admit: 2015-08-10 | Discharge: 2015-08-10 | Payer: PRIVATE HEALTH INSURANCE | Attending: Family Medicine | Primary: Family Medicine

## 2015-08-10 DIAGNOSIS — I10 Essential (primary) hypertension: Secondary | ICD-10-CM

## 2015-08-10 MED ORDER — CITALOPRAM HYDROBROMIDE 40 MG PO TABS
40 MG | ORAL_TABLET | Freq: Every day | ORAL | 5 refills | Status: DC
Start: 2015-08-10 — End: 2016-06-25

## 2015-08-10 MED ORDER — AMLODIPINE BESYLATE 10 MG PO TABS
10 MG | ORAL_TABLET | Freq: Every day | ORAL | 3 refills | Status: DC
Start: 2015-08-10 — End: 2015-12-05

## 2015-08-10 MED ORDER — NAPROXEN 500 MG PO TABS
500 MG | ORAL_TABLET | Freq: Two times a day (BID) | ORAL | 5 refills | Status: DC
Start: 2015-08-10 — End: 2016-11-25

## 2015-08-10 MED ORDER — LEVOTHYROXINE SODIUM 125 MCG PO TABS
125 MCG | ORAL_TABLET | Freq: Every day | ORAL | 5 refills | Status: DC
Start: 2015-08-10 — End: 2016-06-25

## 2015-08-10 MED ORDER — LORAZEPAM 0.5 MG PO TABS
0.5 MG | ORAL_TABLET | Freq: Two times a day (BID) | ORAL | 3 refills | Status: DC | PRN
Start: 2015-08-10 — End: 2016-06-25

## 2015-08-10 MED ORDER — LISINOPRIL-HYDROCHLOROTHIAZIDE 10-12.5 MG PO TABS
ORAL_TABLET | Freq: Every day | ORAL | 5 refills | Status: DC
Start: 2015-08-10 — End: 2015-10-23

## 2015-08-10 NOTE — Progress Notes (Signed)
[facility]   [address] [place], [address]   [place], [place] [address]   [phone]   Kandis Ban, MD     Patient: Christine Lynn  Date of Birth: 1981-03-07  Visit Date: 08/10/15    Lamoine is a 34 y.o. year old female here today for surgery clearance  Chief Complaint   Patient presents with   ??? Cardiac Clearance       HPI  Patient has been having intermittent palpitations. Pulse can go up to 113 while resting and patient gets nervous when this occurs. Patient is having upcoming breast reduction surgery, and is nervous about the procedure.      Patient doing well on current regimen for thyroid. Patient has noticed hair shedding recently. Patient has fatigue. Due for lab work.    Patient is having breast reduction surgery by Dr. Zannie Cove due to upper back pain and neck pain related to large breasts.    Patient doing well on current regimen for hypertension.    Review of Systems   Constitutional: Positive for fatigue.   Eyes: Negative for visual disturbance.   Respiratory: Positive for shortness of breath (with exertion).    Cardiovascular: Positive for palpitations. Negative for chest pain and leg swelling.   Gastrointestinal: Negative for diarrhea, nausea and vomiting.   Genitourinary: Negative for difficulty urinating, dysuria and frequency.   Skin: Negative for rash.       Past medical, surgical, social and/or family history reviewed, updated as needed, and are non-contributory (unless otherwise stated).  Medications, allergies, and problem list also reviewed and updated as needed in patient's record.    Wt Readings from Last 3 Encounters:   08/10/15 278 lb (126.1 kg)   03/23/15 274 lb (124.3 kg)   02/23/15 275 lb (124.7 kg)                   Vitals:    08/10/15 0845   BP: (!) 126/94   Pulse: 76   Resp: 20       Physical Exam   Constitutional: She is oriented to person, place, and time. She appears well-developed and well-nourished.   Neck: Neck supple.   Cardiovascular: Normal  rate, regular rhythm and normal heart sounds.  Exam reveals no gallop.    No murmur heard.  Pulmonary/Chest: Effort normal and breath sounds normal. She has no wheezes. She has no rales.   Abdominal: Soft. Bowel sounds are normal. She exhibits no distension. There is no tenderness.   Musculoskeletal: She exhibits no edema.   Neurological: She is alert and oriented to person, place, and time.   Skin: Skin is warm and dry.   Psychiatric: She has a normal mood and affect.   Vitals reviewed.    Results for orders placed or performed during the hospital encounter of 10/13/14   TSH without Reflex   Result Value Ref Range    TSH 0.513 0.270 - 4.200 uIU/mL   Lipid Panel   Result Value Ref Range    Cholesterol, Total 192 0 - 199 mg/dL    Triglycerides 64 0 - 149 mg/dL    HDL 56 >91 mg/dL    LDL Calculated 478 (H) 0 - 99 mg/dL    VLDL CHOLESTEROL CALCULATED 13 mg/dL   Comprehensive Metabolic Panel   Result Value Ref Range    Sodium 139 132 - 146 mmol/L    Potassium 4.1 3.5 - 5.0 mmol/L    Chloride 102 98 - 107  mmol/L    CO2 27 22 - 29 mmol/L    Anion Gap 10 7 - 16 mmol/L    Glucose 97 74 - 109 mg/dL    BUN 14 6 - 20 mg/dL    CREATININE 0.9 0.5 - 1.0 mg/dL    GFR Non-African American >60 >=60 mL/min/1.73    GFR African American >60     Calcium 9.2 8.6 - 10.2 mg/dL    Total Protein 7.3 6.4 - 8.3 g/dL    Alb 3.9 3.5 - 5.2 g/dL    Total Bilirubin 0.3 0.0 - 1.2 mg/dL    Alkaline Phosphatase 59 35 - 104 U/L    ALT 29 0 - 32 U/L    AST 19 0 - 31 U/L   CBC   Result Value Ref Range    WBC 7.8 4.5 - 11.5 E9/L    RBC 4.00 3.50 - 5.50 E12/L    Hemoglobin 11.6 11.5 - 15.5 g/dL    Hematocrit 62.935.2 52.834.0 - 48.0 %    MCV 87.8 80.0 - 99.9 fL    MCH 28.8 26.0 - 35.0 pg    MCHC 32.8 32.0 - 34.5 %    RDW 13.8 11.5 - 15.0 fL    Platelets 243 130 - 450 E9/L    MPV 9.6 7.0 - 12.0 fL       ASSESSMENT/PLAN  Ndea was seen today for cardiac clearance.    Diagnoses and all orders for this visit:    Essential hypertension  Orders:  -     amLODIPine  (NORVASC) 10 MG tablet; Take 1 tablet by mouth daily  -     lisinopril-hydrochlorothiazide (PRINZIDE;ZESTORETIC) 10-12.5 MG per tablet; Take 1 tablet by mouth daily  -     EKG 12 Lead  -     Lipid Panel; Future  -     Comprehensive Metabolic Panel; Future  -     CBC; Future    Hypothyroidism (acquired)  Orders:  -     levothyroxine (SYNTHROID) 125 MCG tablet; Take 1 tablet by mouth daily  -     TSH without Reflex; Future    Depression, unspecified depression type  Orders:  -     LORazepam (ATIVAN) 0.5 MG tablet; Take 1 tablet by mouth 2 times daily as needed for Anxiety  -     citalopram (CELEXA) 40 MG tablet; Take 1 tablet by mouth daily      Controlled Substances Monitoring: Attestation: The Prescription Monitoring Report for this patient was reviewed today. Mare Ferrari(Alessandria Henken Z Goodwin-Capers, MD)  Documentation: Possible medication side effects, risk of tolerance and/or dependence, and alternative treatments discussed, No signs of potential drug abuse or diversion identified. Mare Ferrari(Harleigh Civello Z Goodwin-Capers, MD)        Phone/MyChart follow up if tests abnormal.    Return in about 2 months (around 10/10/2015) for hypertension. or sooner if necessary.    I have reviewed my findings and recommendations with Cloyce.     Mare FerrariFareedah Z Goodwin-Capers, M.D

## 2015-08-24 ENCOUNTER — Encounter: Primary: Family Medicine

## 2015-09-21 ENCOUNTER — Encounter: Attending: Plastic Surgery | Primary: Family Medicine

## 2015-10-17 ENCOUNTER — Inpatient Hospital Stay
Admit: 2015-10-17 | Discharge: 2015-10-17 | Disposition: A | Discharge status: Home / Self Care | Attending: Emergency Medicine

## 2015-10-17 DIAGNOSIS — N61 Mastitis without abscess: Secondary | ICD-10-CM

## 2015-10-17 LAB — BASIC METABOLIC PANEL
Anion Gap: 14 mmol/L (ref 7–16)
BUN: 10 mg/dL (ref 6–20)
CO2: 23 mmol/L (ref 22–29)
Calcium: 9.7 mg/dL (ref 8.6–10.2)
Chloride: 104 mmol/L (ref 98–107)
Creatinine: 0.8 mg/dL (ref 0.5–1.0)
GFR African American: >60
GFR Non-African American: >60 mL/min/{1.73_m2} (ref >=60)
Glucose: 89 mg/dL (ref 74–109)
Potassium: 4.1 mmol/L (ref 3.5–5.0)
Sodium: 141 mmol/L (ref 132–146)

## 2015-10-17 LAB — CBC
Hematocrit: 37.8 % (ref 34.0–48.0)
Hemoglobin: 12.7 g/dL (ref 11.5–15.5)
MCH: 28.8 pg (ref 26.0–35.0)
MCHC: 33.6 % (ref 32.0–34.5)
MCV: 85.8 fL (ref 80.0–99.9)
MPV: 8.8 fL (ref 7.0–12.0)
Platelets: 265 E9/L (ref 130–450)
RBC: 4.41 E12/L (ref 3.50–5.50)
RDW: 13.6 fL (ref 11.5–15.0)
WBC: 5.8 E9/L (ref 4.5–11.5)

## 2015-10-17 MED ORDER — AMOXICILLIN-POT CLAVULANATE 500-125 MG PO TABS
500-125 MG | ORAL_TABLET | Freq: Two times a day (BID) | ORAL | 0 refills | Status: AC
Start: 2015-10-17 — End: 2015-10-27

## 2015-10-17 NOTE — ED Provider Notes (Signed)
HPI:  10/17/15,   Time: 12:29 PM         Christine Lynn is a 34 y.o. female presenting to the ED for pain to the left breast that is getting worse, beginning 2 weeks ago.  The complaint has been persistent, moderate in severity, and worsened by nothing.  No discharge. No other symptoms    ROS:   Pertinent positives and negatives are stated within HPI, all other systems reviewed and are negative.  --------------------------------------------- PAST HISTORY ---------------------------------------------  Past Medical History:  has a past medical history of Abnormal Pap smear; Anxiety; Depression; Depression; Diabetes mellitus (HCC); Hypertension; Hypothyroidism; Obesity; and Thyroid disease.    Past Surgical History:  has no past surgical history on file.    Social History:  reports that she has never smoked. She has never used smokeless tobacco. She reports that she does not drink alcohol or use illicit drugs.    Family History: family history includes Cancer in her maternal grandfather, maternal grandmother, and another family member; Diabetes in her mother and other family members.     The patient???s home medications have been reviewed.    Allergies: Review of patient's allergies indicates no known allergies.    -------------------------------------------------- RESULTS -------------------------------------------------  All laboratory and radiology results have been personally reviewed by myself   LABS:  Results for orders placed or performed during the hospital encounter of 10/17/15   CBC   Result Value Ref Range    WBC 5.8 4.5 - 11.5 E9/L    RBC 4.41 3.50 - 5.50 E12/L    Hemoglobin 12.7 11.5 - 15.5 g/dL    Hematocrit 16.137.8 09.634.0 - 48.0 %    MCV 85.8 80.0 - 99.9 fL    MCH 28.8 26.0 - 35.0 pg    MCHC 33.6 32.0 - 34.5 %    RDW 13.6 11.5 - 15.0 fL    Platelets 265 130 - 450 E9/L    MPV 8.8 7.0 - 12.0 fL   Basic Metabolic Panel   Result Value Ref Range    Sodium 141 132 - 146 mmol/L    Potassium 4.1 3.5 - 5.0 mmol/L     Chloride 104 98 - 107 mmol/L    CO2 23 22 - 29 mmol/L    Anion Gap 14 7 - 16 mmol/L    Glucose 89 74 - 109 mg/dL    BUN 10 6 - 20 mg/dL    CREATININE 0.8 0.5 - 1.0 mg/dL    GFR Non-African American >60 >=60 mL/min/1.73    GFR African American >60        RADIOLOGY:  Interpreted by Radiologist.  No orders to display       ------------------------- NURSING NOTES AND VITALS REVIEWED ---------------------------   The nursing notes within the ED encounter and vital signs as below have been reviewed.   Visit Vitals   ??? BP 120/81   ??? Pulse 83   ??? Temp 99.1 ??F (37.3 ??C) (Oral)   ??? Resp 16   ??? Wt 278 lb (126.1 kg)   ??? BMI 44.87 kg/m2     Oxygen Saturation Interpretation: Normal      ---------------------------------------------------PHYSICAL EXAM--------------------------------------      Constitutional/General: Alert and oriented x3, well appearing, non toxic in NAD  Head: NC/AT  Eyes: PERRL, EOMI  Mouth: Oropharynx clear, handling secretions, no trismus  Neck: Supple, full ROM, no meningeal signs  Pulmonary: Lungs clear to auscultation bilaterally, no wheezes, rales, or rhonchi. Not in respiratory distress  Cardiovascular:  Regular rate and rhythm, no murmurs, gallops, or rubs. 2+ distal pulses  Breasts: left breast is warm to palpate, no fluctuant mass noted, no nipple discharge noted  Abdomen: Soft, non tender, non distended,   Extremities: Moves all extremities x 4. Warm and well perfused  Skin: warm and dry without rash  Neurologic: GCS 15,  Psych: Normal Affect      ------------------------------ ED COURSE/MEDICAL DECISION MAKING----------------------  Medications - No data to display      Medical Decision Making:      New Prescriptions    AMOXICILLIN-CLAVULANATE (AUGMENTIN) 500-125 MG PER TABLET    Take 1 tablet by mouth 2 times daily (with meals) for 10 days       WARM COMPRESSES LOCALLY. FOLLOW UP WITH OBGYN.    Counseling:   The emergency provider has spoken with the patient and discussed today???s results, in  addition to providing specific details for the plan of care and counseling regarding the diagnosis and prognosis.  Questions are answered at this time and they are agreeable with the plan.      --------------------------------- IMPRESSION AND DISPOSITION ---------------------------------    IMPRESSION  1. Mastitis, left, acute        DISPOSITION  Disposition: Discharge to home  Patient condition is good                 Delma Officer, MD  10/17/15 1326

## 2015-10-23 ENCOUNTER — Ambulatory Visit
Admit: 2015-10-23 | Discharge: 2015-10-23 | Payer: PRIVATE HEALTH INSURANCE | Attending: Family Medicine | Primary: Family Medicine

## 2015-10-23 DIAGNOSIS — I10 Essential (primary) hypertension: Secondary | ICD-10-CM

## 2015-10-23 MED ORDER — LISINOPRIL-HYDROCHLOROTHIAZIDE 20-12.5 MG PO TABS
ORAL_TABLET | Freq: Every day | ORAL | 3 refills | Status: DC
Start: 2015-10-23 — End: 2015-12-05

## 2015-10-23 NOTE — Patient Instructions (Signed)
High Blood Pressure: Care Instructions  Your Care Instructions  If your blood pressure is usually above 140/90, you have high blood pressure, or hypertension. That means the top number is 140 or higher or the bottom number is 90 or higher, or both.  Despite what a lot of people think, high blood pressure usually doesn't cause headaches or make you feel dizzy or lightheaded. It usually has no symptoms. But it does increase your risk for heart attack, stroke, and kidney or eye damage. The higher your blood pressure, the more your risk increases.  Your doctor will give you a goal for your blood pressure. Your goal will be based on your health and your age. An example of a goal is to keep your blood pressure below 140/90.  Lifestyle changes, such as eating healthy and being active, are always important to help lower blood pressure. You might also take medicine to reach your blood pressure goal.  Follow-up care is a key part of your treatment and safety. Be sure to make and go to all appointments, and call your doctor if you are having problems. It's also a good idea to know your test results and keep a list of the medicines you take.  How can you care for yourself at home?  Medical treatment  ?? If you stop taking your medicine, your blood pressure will go back up. You may take one or more types of medicine to lower your blood pressure. Be safe with medicines. Take your medicine exactly as prescribed. Call your doctor if you think you are having a problem with your medicine.  ?? Talk to your doctor before you start taking aspirin every day. Aspirin can help certain people lower their risk of a heart attack or stroke. But taking aspirin isn't right for everyone, because it can cause serious bleeding.  ?? See your doctor regularly. You may need to see the doctor more often at first or until your blood pressure comes down.  ?? If you are taking blood pressure medicine, talk to your doctor before you take decongestants or  anti-inflammatory medicine, such as ibuprofen. Some of these medicines can raise blood pressure.  ?? Learn how to check your blood pressure at home.  Lifestyle changes  ?? Stay at a healthy weight. This is especially important if you put on weight around the waist. Losing even 10 pounds can help you lower your blood pressure.  ?? If your doctor recommends it, get more exercise. Walking is a good choice. Bit by bit, increase the amount you walk every day. Try for at least 30 minutes on most days of the week. You also may want to swim, bike, or do other activities.  ?? Avoid or limit alcohol. Talk to your doctor about whether you can drink any alcohol.  ?? Try to limit how much sodium you eat to less than 2,300 milligrams (mg) a day. Your doctor may ask you to try to eat less than 1,500 mg a day.  ?? Eat plenty of fruits (such as bananas and oranges), vegetables, legumes, whole grains, and low-fat dairy products.  ?? Lower the amount of saturated fat in your diet. Saturated fat is found in animal products such as milk, cheese, and meat. Limiting these foods may help you lose weight and also lower your risk for heart disease.  ?? Do not smoke. Smoking increases your risk for heart attack and stroke. If you need help quitting, talk to your doctor about stop-smoking programs and medicines.   These can increase your chances of quitting for good.  When should you call for help?  Call your doctor now or seek immediate medical care if:  ?? Your blood pressure is much higher than normal (such as 180/110 or higher).  ?? You think high blood pressure is causing symptoms such as:  ?? Severe headache.  ?? Blurry vision.  Watch closely for changes in your health, and be sure to contact your doctor if:  ?? You do not get better as expected.  Where can you learn more?  Go to [email or web address] and sign in to your MyChart account. Enter X567 in the Search Health Information box to learn more about ???High Blood Pressure: Care  Instructions.???    If you do not have an account, please click on the ???Sign Up Now??? link.  ?? 2006-2016 [employer]. Care instructions adapted under license by [facility]. This care instruction is for use with your licensed healthcare professional. If you have questions about a medical condition or this instruction, always ask your healthcare professional. [employer] disclaims any warranty or liability for your use of this information.  Content Version: 10.9.538570; Current as of: January 25, 2015

## 2015-10-23 NOTE — Progress Notes (Signed)
Chief Complaint   Patient presents with   ??? Hypertension   ??? ED Follow-up     left breast pain       Patient is here for follow up for follow up for hypertension, thyroid    Hypertension:  Patient is here for follow up chronic hypertension.  This is  generally controlled on current medication regimen.  BP today is 132/90.  Takes meds as directed and tolerates them well.  Most recent labs reviewed with patient and are not remarkable.  No symptoms from htn standpoint per ROS.  Patient is  compliant with lifestyle modifications.  Patient does not smoke.  Comorbid conditions include hypothyroidism.      Patient was treated for left breast pain that started 2 weeks ago. Patient went to urgent care last week and was treated with antibiotics. Patient has noticed pain is still present but not as severe. Denies nipple discharge or masses. Breast still feels slightly warm to touch.    Patient doing well on current regimen for thyroid. Patient has been more tired lately and hair is thinning more.    Patient's past medical, surgical, social and/or family history reviewed, updated in chart, and are non-contributory (unless otherwise stated).  Medications and allergies also reviewed and updated in chart.      Visit Vitals   ??? BP 132/90 (Site: Left Arm, Cuff Size: Large Adult)   ??? Pulse 76   ??? Resp 20   ??? Ht  (1.727 m)   ??? Wt 267 lb (121.1 kg)   ??? BMI 40.6 kg/m2       Review of Systems   Constitutional: Positive for fatigue and fever (low grade).   Eyes: Negative for visual disturbance.   Respiratory: Negative for shortness of breath.    Cardiovascular: Negative for chest pain, palpitations and leg swelling.   Gastrointestinal: Negative for diarrhea, nausea and vomiting.   Genitourinary: Negative for difficulty urinating, dysuria and frequency.   Skin: Negative for rash.       Physical Exam   Constitutional: She is oriented to person, place, and time. She appears well-developed and well-nourished. No distress.   Neck: Neck  supple. Carotid bruit is not present.   Cardiovascular: Normal rate, regular rhythm and normal heart sounds.  Exam reveals no gallop.    No murmur heard.  Pulmonary/Chest: Effort normal and breath sounds normal. She has no wheezes. She has no rales.   Abdominal: Soft. Bowel sounds are normal. She exhibits no distension. There is no tenderness.   Genitourinary: There is breast tenderness (generalized left breast). No breast swelling or discharge.   Musculoskeletal: She exhibits no edema.   Neurological: She is alert and oriented to person, place, and time.   Skin: Skin is warm and dry.   Psychiatric: She has a normal mood and affect.   Vitals reviewed.    Results for orders placed or performed during the hospital encounter of 10/17/15   CBC   Result Value Ref Range    WBC 5.8 4.5 - 11.5 E9/L    RBC 4.41 3.50 - 5.50 E12/L    Hemoglobin 12.7 11.5 - 15.5 g/dL    Hematocrit 16.1 09.6 - 48.0 %    MCV 85.8 80.0 - 99.9 fL    MCH 28.8 26.0 - 35.0 pg    MCHC 33.6 32.0 - 34.5 %    RDW 13.6 11.5 - 15.0 fL    Platelets 265 130 - 450 E9/L    MPV 8.8  7.0 - 12.0 fL   Basic Metabolic Panel   Result Value Ref Range    Sodium 141 132 - 146 mmol/L    Potassium 4.1 3.5 - 5.0 mmol/L    Chloride 104 98 - 107 mmol/L    CO2 23 22 - 29 mmol/L    Anion Gap 14 7 - 16 mmol/L    Glucose 89 74 - 109 mg/dL    BUN 10 6 - 20 mg/dL    CREATININE 0.8 0.5 - 1.0 mg/dL    GFR Non-African American >60 >=60 mL/min/1.73    GFR African American >60     Calcium 9.7 8.6 - 10.2 mg/dL       Marolyn was seen today for hypertension and ed follow-up.    Diagnoses and all orders for this visit:    Essential hypertension  -     Lipid Panel; Future  -     Increase lisinopril-hydrochlorothiazide (PRINZIDE;ZESTORETIC) 20-12.5 MG per tablet; Take 1 tablet by mouth daily        -     Continue Norvasc 10 mg daily    Breast pain, left  -     MAM US Breast Left Complete; Future    Hypothyroidism (acquired)  -     TSH without Reflex; Future        -     Continue Synthroid;  adjust dose accordingly        Phone/MyChart follow up if tests abnormal.    Return in about 2 months (around 12/23/2015) for hypertension., or sooner if necessary.      Educational materials and/or home exercises printed for patient's review and were included in patient instructions on his/her After Visit Summary and given to patient at the end of visit.      Counseled regarding above diagnosis, including possible risks and complications,  especially if left uncontrolled.    Counseled regarding the possible side effects, risks, benefits and alternatives to treatment; patient and/or guardian verbalizes understanding, agrees, feels comfortable with and wishes to proceed with above treatment plan.    Advised patient to call with any new medication issues, and read all Rx info from pharmacy to assure aware of all possible risks and side effects of medication before taking.    Reviewed age and gender appropriate health screening exams and vaccinations.  Advised patient regarding importance of keeping up with recommended health maintenance and to schedule as soon as possible if overdue, as this is important in assessing for undiagnosed pathology, especially cancer, as well as protecting against potentially harmful/life threatening disease.        Patient and/or guardian verbalizes understanding and agrees with above counseling, assessment and plan.    All questions answered.

## 2015-11-03 ENCOUNTER — Encounter: Primary: Family Medicine

## 2015-11-06 ENCOUNTER — Encounter: Primary: Family Medicine

## 2015-11-10 ENCOUNTER — Encounter

## 2015-11-10 LAB — TSH: TSH: 1.49 u[IU]/mL (ref 0.270–4.200)

## 2015-11-10 LAB — LIPID PANEL
Cholesterol, Total: 197 mg/dL (ref 0–199)
HDL: 57 mg/dL (ref >40)
LDL Calculated: 133 mg/dL — ABNORMAL HIGH (ref 0–99)
Triglycerides: 37 mg/dL (ref 0–149)
VLDL Cholesterol Calculated: 7 mg/dL

## 2015-11-10 NOTE — Progress Notes (Signed)
Please call patient. Continue same dose of Synthroid.

## 2015-12-05 ENCOUNTER — Ambulatory Visit
Admit: 2015-12-05 | Discharge: 2015-12-05 | Payer: PRIVATE HEALTH INSURANCE | Attending: Family Medicine | Primary: Family Medicine

## 2015-12-05 DIAGNOSIS — I10 Essential (primary) hypertension: Secondary | ICD-10-CM

## 2015-12-05 MED ORDER — AMLODIPINE BESYLATE 10 MG PO TABS
10 MG | ORAL_TABLET | Freq: Every day | ORAL | 5 refills | Status: DC
Start: 2015-12-05 — End: 2016-06-25

## 2015-12-05 MED ORDER — TOPIRAMATE 50 MG PO TABS
50 MG | ORAL_TABLET | Freq: Every day | ORAL | 3 refills | Status: DC
Start: 2015-12-05 — End: 2016-06-25

## 2015-12-05 MED ORDER — LISINOPRIL-HYDROCHLOROTHIAZIDE 20-25 MG PO TABS
20-25 MG | ORAL_TABLET | Freq: Every day | ORAL | 3 refills | Status: DC
Start: 2015-12-05 — End: 2016-06-25

## 2015-12-05 NOTE — Progress Notes (Signed)
Chief Complaint   Patient presents with   ??? Hypertension     discuss wt loss   ??? Medication Refill       Patient is here for follow up for hypertension    Hypertension:  Patient is here for follow up chronic hypertension.  This is not generally controlled on current medication regimen.  BP today is 134/92.  Takes meds as directed and tolerates them well.  Most recent labs reviewed with patient and are not remarkable.  Patient has been having headaches frequently.  Patient is  compliant with lifestyle modifications.  Patient does not smoke.  Comorbid conditions include obesity.      Patient doing well on current regimen for thyroid.    Patient having difficulty losing weight.      Patient's past medical, surgical, social and/or family history reviewed, updated in chart, and are non-contributory (unless otherwise stated).  Medications and allergies also reviewed and updated in chart.      Visit Vitals   ??? BP (!) 134/92   ??? Pulse 76   ??? Resp 20   ??? Ht 5\' 7"  (1.702 m)   ??? Wt 278 lb (126.1 kg)   ??? BMI 43.54 kg/m2       Review of Systems   Eyes: Positive for photophobia (with headaches). Negative for visual disturbance.   Respiratory: Negative for shortness of breath.    Cardiovascular: Negative for chest pain, palpitations and leg swelling.   Gastrointestinal: Negative for diarrhea, nausea and vomiting.   Genitourinary: Negative for difficulty urinating, dysuria and frequency.   Skin: Negative for rash.   Neurological: Positive for headaches.       Physical Exam   Constitutional: She is oriented to person, place, and time. She appears well-developed and well-nourished. No distress.   Neck: Neck supple. Carotid bruit is not present.   Cardiovascular: Normal rate, regular rhythm and normal heart sounds.  Exam reveals no gallop.    No murmur heard.  Pulmonary/Chest: Effort normal and breath sounds normal. She has no wheezes. She has no rales.   Abdominal: Soft. Bowel sounds are normal. She exhibits no distension. There is  no tenderness.   Musculoskeletal: She exhibits no edema.   Neurological: She is alert and oriented to person, place, and time.   Skin: Skin is warm and dry.   Psychiatric: She has a normal mood and affect.   Vitals reviewed.    Results for orders placed or performed during the hospital encounter of 11/10/15   TSH without Reflex   Result Value Ref Range    TSH 1.490 0.270 - 4.200 uIU/mL   Lipid Panel   Result Value Ref Range    Cholesterol, Total 197 0 - 199 mg/dL    Triglycerides 37 0 - 149 mg/dL    HDL 57 >16>40 mg/dL    LDL Calculated 109133 (H) 0 - 99 mg/dL    VLDL CHOLESTEROL CALCULATED 7 mg/dL       Karysa was seen today for hypertension and medication refill.    Diagnoses and all orders for this visit:    Essential hypertension  -     amLODIPine (NORVASC) 10 MG tablet; Take 1 tablet by mouth daily  -     lisinopril-hydrochlorothiazide (PRINZIDE;ZESTORETIC) 20-25 MG per tablet; Take 1 tablet by mouth daily  -     Comprehensive Metabolic Panel; Future    Abnormal weight gain  -     Start topiramate (TOPAMAX) 50 MG tablet; Take 1 tablet  by mouth daily        -     BMI was elevated today, and weight loss plan recommended is : medically supervised diet with primary care physician.    Hypothyroidism (acquired)        -     Continue Synthroid            Phone/MyChart follow up if tests abnormal.    Return in about 6 weeks (around 01/16/2016) for hypertension., or sooner if necessary.      Educational materials and/or home exercises printed for patient's review and were included in patient instructions on his/her After Visit Summary and given to patient at the end of visit.      Counseled regarding above diagnosis, including possible risks and complications,  especially if left uncontrolled.    Counseled regarding the possible side effects, risks, benefits and alternatives to treatment; patient and/or guardian verbalizes understanding, agrees, feels comfortable with and wishes to proceed with above treatment  plan.    Advised patient to call with any new medication issues, and read all Rx info from pharmacy to assure aware of all possible risks and side effects of medication before taking.    Reviewed age and gender appropriate health screening exams and vaccinations.  Advised patient regarding importance of keeping up with recommended health maintenance and to schedule as soon as possible if overdue, as this is important in assessing for undiagnosed pathology, especially cancer, as well as protecting against potentially harmful/life threatening disease.        Patient and/or guardian verbalizes understanding and agrees with above counseling, assessment and plan.    All questions answered.

## 2015-12-05 NOTE — Patient Instructions (Addendum)
Eating Healthy Foods: Care Instructions  Your Care Instructions  Eating healthy foods can help lower your risk for disease. Healthy food gives you energy and keeps your heart strong, your brain active, your muscles working, and your bones strong.  A healthy diet includes a variety of foods from the basic food groups: grains, vegetables, fruits, milk and milk products, and meat and beans. Some people may eat more of their favorite foods from only one food group and, as a result, miss getting the nutrients they need. So, it is important to pay attention not only to what you eat but also to what you are missing from your diet. You can eat a healthy, balanced diet by making a few small changes.  Follow-up care is a key part of your treatment and safety. Be sure to make and go to all appointments, and call your doctor if you are having problems. It???s also a good idea to know your test results and keep a list of the medicines you take.  How can you care for yourself at home?  Look at what you eat  ?? Keep a food diary for a week or two and record everything you eat or drink. Track the number of servings you eat from each food group.  ?? For a balanced diet every day, eat a variety of:  ?? 6 or more ounce-equivalents of grains, such as cereals, breads, crackers, rice, or pasta, every day. An ounce-equivalent is 1 slice of bread, 1 cup of ready-to-eat cereal, or ?? cup of cooked rice, cooked pasta, or cooked cereal.  ?? 2?? cups of vegetables, especially:  ?? Dark-green vegetables such as broccoli and spinach.  ?? Orange vegetables such as carrots and sweet potatoes.  ?? Dry beans (such as pinto and kidney beans) and peas (such as lentils).  ?? 2 cups of fresh, frozen, or canned fruit. A small apple or 1 banana or orange equals 1 cup.  ?? 3 cups of nonfat or low-fat milk, yogurt, or other milk products.  ?? 5?? ounces of meat and beans, such as chicken, fish, lean meat, beans, nuts, and seeds. One egg, 1 tablespoon of peanut butter, ??  ounce nuts or seeds, or ?? cup of cooked beans equals 1 ounce of meat.  ?? Learn how to read food labels for serving sizes and ingredients. Fast-food and convenience-food meals often contain few or no fruits or vegetables. Make sure you eat some fruits and vegetables to make the meal more nutritious.  ?? Look at your food diary. For each food group, add up what you have eaten and then divide the total by the number of days. This will give you an idea of how much you are eating from each food group. See if you can find some ways to change your diet to make it more healthy.  Start small  ?? Do not try to make dramatic changes to your diet all at once. You might feel that you are missing out on your favorite foods and then be more likely to fail.  ?? Start slowly, and gradually change your habits. Try some of the following:  ?? Use whole wheat bread instead of white bread.  ?? Use nonfat or low-fat milk instead of whole milk.  ?? Eat brown rice instead of white rice, and eat whole wheat pasta instead of white-flour pasta.  ?? Try low-fat cheeses and low-fat yogurt.  ?? Add more fruits and vegetables to meals and have them for snacks.  ??   Add lettuce, tomato, cucumber, and onion to sandwiches.  ?? Add fruit to yogurt and cereal.  Enjoy food  ?? You can still eat your favorite foods. You just may need to eat less of them. If your favorite foods are high in fat, salt, and sugar, limit how often you eat them, but do not cut them out entirely.  ?? Eat a wide variety of foods.  Make healthy choices when eating out  ?? The type of restaurant you choose can help you make healthy choices. Even fast-food chains are now offering more low-fat or healthier choices on the menu.  ?? Choose smaller portions, or take half of your meal home.  ?? When eating out, try:  ?? A veggie pizza with a whole wheat crust or grilled chicken (instead of sausage or pepperoni).  ?? Pasta with roasted vegetables, grilled chicken, or marinara sauce instead of cream  sauce.  ?? A vegetable wrap or grilled chicken wrap.  ?? Broiled or poached food instead of fried or breaded items.  Make healthy choices easy  ?? Buy packaged, prewashed, ready-to-eat fresh vegetables and fruits, such as baby carrots, salad mixes, and chopped or shredded broccoli and cauliflower.  ?? Buy packaged, presliced fruits, such as melon or pineapple.  ?? Choose 100% fruit or vegetable juice instead of soda. Limit juice intake to 4 to 6 oz (?? to ?? cup) a day.  ?? Blend low-fat yogurt, fruit juice, and canned or frozen fruit to make a smoothie for breakfast or a snack.  Where can you learn more?  Go to [email or web address] and sign in to your MyChart account. Enter [id number] in the Search Health Information box to learn more about ???Eating Healthy Foods: Care Instructions.???    If you do not have an account, please click on the ???Sign Up Now??? link.  ?? 2006-2016 [employer]. Care instructions adapted under license by  [facility]. This care instruction is for use with your licensed healthcare professional. If you have questions about a medical condition or this instruction, always ask your healthcare professional. [employer], [employer] disclaims any warranty or liability for your use of this information.  Content Version: 11.0.578772; Current as of: November 18, 2014        High Blood Pressure: Care Instructions  Your Care Instructions  If your blood pressure is usually above 140/90, you have high blood pressure, or hypertension. That means the top number is 140 or higher or the bottom number is 90 or higher, or both.  Despite what a lot of people think, high blood pressure usually doesn't cause headaches or make you feel dizzy or lightheaded. It usually has no symptoms. But it does increase your risk for heart attack, stroke, and kidney or eye damage. The higher your blood pressure, the more your risk increases.  Your doctor will give you a goal for your blood pressure. Your goal will be  based on your health and your age. An example of a goal is to keep your blood pressure below 140/90.  Lifestyle changes, such as eating healthy and being active, are always important to help lower blood pressure. You might also take medicine to reach your blood pressure goal.  Follow-up care is a key part of your treatment and safety. Be sure to make and go to all appointments, and call your doctor if you are having problems. It's also a good idea to know your test results and keep a list of the medicines you take.  How can you care  for yourself at home?  Medical treatment  ?? If you stop taking your medicine, your blood pressure will go back up. You may take one or more types of medicine to lower your blood pressure. Be safe with medicines. Take your medicine exactly as prescribed. Call your doctor if you think you are having a problem with your medicine.  ?? Talk to your doctor before you start taking aspirin every day. Aspirin can help certain people lower their risk of a heart attack or stroke. But taking aspirin isn't right for everyone, because it can cause serious bleeding.  ?? See your doctor regularly. You may need to see the doctor more often at first or until your blood pressure comes down.  ?? If you are taking blood pressure medicine, talk to your doctor before you take decongestants or anti-inflammatory medicine, such as ibuprofen. Some of these medicines can raise blood pressure.  ?? Learn how to check your blood pressure at home.  Lifestyle changes  ?? Stay at a healthy weight. This is especially important if you put on weight around the waist. Losing even 10 pounds can help you lower your blood pressure.  ?? If your doctor recommends it, get more exercise. Walking is a good choice. Bit by bit, increase the amount you walk every day. Try for at least 30 minutes on most days of the week. You also may want to swim, bike, or do other activities.  ?? Avoid or limit alcohol. Talk to your doctor about whether you  can drink any alcohol.  ?? Try to limit how much sodium you eat to less than 2,300 milligrams (mg) a day. Your doctor may ask you to try to eat less than 1,500 mg a day.  ?? Eat plenty of fruits (such as bananas and oranges), vegetables, legumes, whole grains, and low-fat dairy products.  ?? Lower the amount of saturated fat in your diet. Saturated fat is found in animal products such as milk, cheese, and meat. Limiting these foods may help you lose weight and also lower your risk for heart disease.  ?? Do not smoke. Smoking increases your risk for heart attack and stroke. If you need help quitting, talk to your doctor about stop-smoking programs and medicines. These can increase your chances of quitting for good.  When should you call for help?  Call your doctor now or seek immediate medical care if:  ?? Your blood pressure is much higher than normal (such as 180/110 or higher).  ?? You think high blood pressure is causing symptoms such as:  ?? Severe headache.  ?? Blurry vision.  Watch closely for changes in your health, and be sure to contact your doctor if:  ?? You do not get better as expected.  Where can you learn more?  Go to [email or web address] and sign in to your MyChart account. Enter [phone] in the Search Health Information box to learn more about ???High Blood Pressure: Care Instructions.???    If you do not have an account, please click on the ???Sign Up Now??? link.  ?? 2006-2016 [employer]. Care instructions adapted under license by [facility]. This care instruction is for use with your licensed healthcare professional. If you have questions about a medical condition or this instruction, always ask your healthcare professional. [employer] disclaims any warranty or liability for your use of this information.  Content Version: 11.0.578772; Current as of: January 25, 2015

## 2015-12-28 ENCOUNTER — Encounter: Attending: Family Medicine | Primary: Family Medicine

## 2016-01-17 ENCOUNTER — Encounter: Payer: PRIVATE HEALTH INSURANCE | Attending: Family Medicine | Primary: Family Medicine

## 2016-01-18 NOTE — Telephone Encounter (Signed)
No show on 1/18 for HTN f/u with Dr. Geraldo PitterGoodwin. She was r/s for 03/19/16.

## 2016-03-19 ENCOUNTER — Encounter: Attending: Family Medicine | Primary: Family Medicine

## 2016-05-13 ENCOUNTER — Encounter

## 2016-05-16 ENCOUNTER — Inpatient Hospital Stay
Admit: 2016-05-16 | Discharge: 2016-05-16 | Disposition: A | Discharge status: Home / Self Care | Attending: Family Medicine

## 2016-05-16 DIAGNOSIS — J40 Bronchitis, not specified as acute or chronic: Secondary | ICD-10-CM

## 2016-05-16 MED ORDER — AZITHROMYCIN 250 MG PO TABS
250 MG | PACK | ORAL | 0 refills | Status: AC
Start: 2016-05-16 — End: 2016-05-26

## 2016-05-16 MED ORDER — LORATADINE 10 MG PO TABS
10 MG | ORAL_TABLET | ORAL | 0 refills | Status: DC
Start: 2016-05-16 — End: 2017-03-29

## 2016-05-16 MED ORDER — ALBUTEROL SULFATE HFA 108 (90 BASE) MCG/ACT IN AERS
108 (90 Base) MCG/ACT | Freq: Four times a day (QID) | RESPIRATORY_TRACT | 0 refills | Status: DC | PRN
Start: 2016-05-16 — End: 2016-06-25

## 2016-05-16 MED ORDER — PROMETHAZINE-DM 6.25-15 MG/5ML PO SYRP
Freq: Four times a day (QID) | ORAL | 0 refills | Status: AC | PRN
Start: 2016-05-16 — End: 2016-05-23

## 2016-05-16 NOTE — ED Provider Notes (Signed)
Patient is a 35 y.o. female presenting with URI. The history is provided by the patient.   URI   Presenting symptoms: congestion, cough, rhinorrhea and sore throat    Presenting symptoms: no ear pain, no facial pain, no fatigue and no fever    Severity:  Moderate  Onset quality:  Gradual  Duration:  3 weeks  Timing:  Intermittent  Progression:  Waxing and waning  Chronicity:  New  Relieved by:  Nothing  Worsened by:  Certain positions  Ineffective treatments:  Decongestant  Associated symptoms: sneezing    Associated symptoms: no arthralgias, no headaches, no myalgias, no sinus pain, no swollen glands and no wheezing    Risk factors: no sick contacts        Review of Systems   Constitutional: Negative for activity change, appetite change, chills, diaphoresis, fatigue, fever and unexpected weight change.   HENT: Positive for congestion, rhinorrhea, sneezing and sore throat. Negative for ear pain, postnasal drip and trouble swallowing.    Eyes: Negative for visual disturbance.   Respiratory: Positive for cough. Negative for apnea, chest tightness, shortness of breath and wheezing.    Cardiovascular: Negative for chest pain, palpitations and leg swelling.   Gastrointestinal: Negative for abdominal distention, abdominal pain, blood in stool, constipation, diarrhea, nausea and vomiting.   Genitourinary: Negative for decreased urine volume, difficulty urinating, dysuria, frequency and urgency.   Musculoskeletal: Negative for arthralgias, back pain, joint swelling and myalgias.   Skin: Negative for color change, pallor and rash.   Neurological: Negative for dizziness, weakness, light-headedness, numbness and headaches.   Hematological: Does not bruise/bleed easily.   Psychiatric/Behavioral: Negative for agitation, confusion, dysphoric mood, sleep disturbance and suicidal ideas. The patient is not nervous/anxious.        Physical Exam   Constitutional: She is oriented to person, place, and time. She appears well-developed  and well-nourished. No distress.   HENT:   Head: Normocephalic and atraumatic.   Right Ear: External ear normal.   Left Ear: External ear normal.   Nose: Nose normal.   Mouth/Throat: Mucous membranes are normal. Posterior oropharyngeal edema and posterior oropharyngeal erythema (PND) present. No oropharyngeal exudate or tonsillar abscesses.   Eyes: Conjunctivae and EOM are normal. Pupils are equal, round, and reactive to light. No scleral icterus.   Neck: Normal range of motion. Neck supple.   Cardiovascular: Normal rate, regular rhythm, normal heart sounds and intact distal pulses.  Exam reveals no gallop and no friction rub.    No murmur heard.  Pulmonary/Chest: Effort normal and breath sounds normal. No respiratory distress. She has no wheezes. She has no rales.   Abdominal: Soft. Bowel sounds are normal. She exhibits no distension. There is no tenderness.   Musculoskeletal: Normal range of motion.   Neurological: She is alert and oriented to person, place, and time. She has normal reflexes.   Skin: Skin is warm and dry. She is not diaphoretic.   Psychiatric: She has a normal mood and affect. Her behavior is normal. Thought content normal.   Nursing note and vitals reviewed.      Procedures    MDM    --------------------------------------------- PAST HISTORY ---------------------------------------------  Past Medical History:  has a past medical history of Abnormal Pap smear; Anxiety; Depression; Depression; Diabetes mellitus (HCC); Hypertension; Hypothyroidism; Obesity; and Thyroid disease.    Past Surgical History:  has no past surgical history on file.    Social History:  reports that she has never smoked. She has never used smokeless  tobacco. She reports that she does not drink alcohol or use illicit drugs.    Family History: family history includes Cancer in her maternal grandfather, maternal grandmother, and another family member; Diabetes in her mother and other family members.     The patient???s home  medications have been reviewed.    Allergies: Review of patient's allergies indicates no known allergies.    -------------------------------------------------- RESULTS -------------------------------------------------  Labs:  No results found for this visit on 05/16/16.    Radiology:  No results found.    ------------------------- NURSING NOTES AND VITALS REVIEWED ---------------------------  The nursing notes within the ED encounter and vital signs as below have been reviewed.   BP 111/81   Pulse 88   Temp 98.5 ??F (36.9 ??C) (Oral)    Resp 16   Ht  (1.702 m)   Wt 270 lb (122.5 kg)   LMP 05/02/2016   SpO2 99%   BMI 42.29 kg/m2  Oxygen Saturation Interpretation: Normal      ------------------------------------------ PROGRESS NOTES ------------------------------------------  I have spoken with the patient and discussed today???s results, in addition to providing specific details for the plan of care and counseling regarding the diagnosis and prognosis.  Their questions are answered at this time and they are agreeable with the plan.      --------------------------------- ADDITIONAL PROVIDER NOTES ---------------------------------  At this time the patient is without objective evidence of an acute process requiring hospitalization or inpatient management.  They have remained hemodynamically stable throughout their entire ED visit and are stable for discharge with outpatient follow-up.     The plan has been discussed in detail and they are aware of the specific conditions for emergent return, as well as the importance of follow-up.      Discharge Medication List as of 05/16/2016 10:25 AM      START taking these medications    Details   azithromycin (ZITHROMAX Z-PAK) 250 MG tablet TAKE  PO DAY ONE...  PO DAY TWO THROUGH FIVE   DISPENSE 6 TABS  NO REFILLS, Disp-1 packet, R-0Print      albuterol sulfate HFA (PROVENTIL HFA) 108 (90 BASE) MCG/ACT inhaler Inhale 2 puffs into the lungs every 6 hours as needed for  Wheezing, Disp-1 Inhaler, R-0Print      promethazine-dextromethorphan (PROMETHAZINE-DM) 6.25-15 MG/5ML syrup Take 5 mLs by mouth 4 times daily as needed for Cough, Disp-120 mL, R-0Print      loratadine (CLARITIN) 10 MG tablet Take 1 PO Daily for allergic symptoms., Disp-30 tablet, R-0Print             Diagnosis:  1. Bronchitis        Disposition:  Patient's disposition: Discharge to home  Patient's condition is stable.       Colin Broach Marckus Hanover, DO  05/16/16 1110

## 2016-06-25 ENCOUNTER — Ambulatory Visit
Admit: 2016-06-25 | Discharge: 2016-06-25 | Payer: PRIVATE HEALTH INSURANCE | Attending: Physician Assistant | Primary: Family Medicine

## 2016-06-25 DIAGNOSIS — Z0000 Encounter for general adult medical examination without abnormal findings: Secondary | ICD-10-CM

## 2016-06-25 MED ORDER — LISINOPRIL-HYDROCHLOROTHIAZIDE 20-25 MG PO TABS
20-25 | ORAL_TABLET | Freq: Every day | ORAL | 3 refills | Status: DC
Start: 2016-06-25 — End: 2016-10-24

## 2016-06-25 MED ORDER — ALBUTEROL SULFATE HFA 108 (90 BASE) MCG/ACT IN AERS
108 | Freq: Four times a day (QID) | RESPIRATORY_TRACT | 2 refills | Status: DC | PRN
Start: 2016-06-25 — End: 2017-03-29

## 2016-06-25 MED ORDER — LEVOTHYROXINE SODIUM 125 MCG PO TABS
125 | ORAL_TABLET | Freq: Every day | ORAL | 5 refills | Status: DC
Start: 2016-06-25 — End: 2016-10-24

## 2016-06-25 MED ORDER — AMLODIPINE BESYLATE 10 MG PO TABS
10 | ORAL_TABLET | Freq: Every day | ORAL | 5 refills | Status: DC
Start: 2016-06-25 — End: 2016-10-24

## 2016-06-25 MED ORDER — LORAZEPAM 0.5 MG PO TABS
0.5 | ORAL_TABLET | Freq: Two times a day (BID) | ORAL | 3 refills | Status: DC | PRN
Start: 2016-06-25 — End: 2016-10-24

## 2016-06-25 MED ORDER — CITALOPRAM HYDROBROMIDE 40 MG PO TABS
40 | ORAL_TABLET | Freq: Every day | ORAL | 5 refills | Status: DC
Start: 2016-06-25 — End: 2016-10-24

## 2016-06-25 MED ORDER — TOPIRAMATE 50 MG PO TABS
50 | ORAL_TABLET | Freq: Every day | ORAL | 3 refills | Status: DC
Start: 2016-06-25 — End: 2016-10-24

## 2016-06-26 NOTE — Progress Notes (Signed)
OFFICE PROGRESS NOTE      SUBJECTIVE:        Patient ID:   Christine Lynn is a 35 y.o. female who presents for school physical, hypertension, hypothyroidism, depression, and abnormal weight gain    Chief Complaint   Patient presents with   ??? Annual Exam     needs tiiters and 10 panel drug screen    ??? Medication Refill     HPI:   HPI   Patient presents for school physical. Overall, she states that she feels well. Denies chest pain or shortness of breath. Patient states that she has no difficulties with vision or hearing. Patient is a nonsmoker. She states that she is up to date on TB tests due to employment. She is in need of forms to be filled out.     Patient is doing well on current regimen for hypertension.     Patient is doing well on current regimen for hypothyroidism.     Patient is doing well on current regimen for depression. Takes Celexa and Ativan as needed.    Patient is doing well on current regimen for abnormal weight gain. She is taking Topamax with no difficulties.       Prior to Admission medications    Medication Sig Start Date End Date Taking? Authorizing Provider   albuterol sulfate HFA (PROVENTIL HFA) 108 (90 Base) MCG/ACT inhaler Inhale 2 puffs into the lungs every 6 hours as needed for Wheezing 06/25/16 07/02/16 Yes Myasia Sinatra, PA-C   amLODIPine (NORVASC) 10 MG tablet Take 1 tablet by mouth daily 06/25/16  Yes Mccartney Brucks, PA-C   lisinopril-hydrochlorothiazide (PRINZIDE;ZESTORETIC) 20-25 MG per tablet Take 1 tablet by mouth daily 06/25/16  Yes Amirrah Quigley, PA-C   topiramate (TOPAMAX) 50 MG tablet Take 1 tablet by mouth daily 06/25/16  Yes Osmel Dykstra, PA-C   levothyroxine (SYNTHROID) 125 MCG tablet Take 1 tablet by mouth daily 06/25/16  Yes Brinden Kincheloe, PA-C   LORazepam (ATIVAN) 0.5 MG tablet Take 1 tablet by mouth 2 times daily as needed for Anxiety 06/25/16  Yes Sundi Slevin, PA-C   citalopram (CELEXA) 40 MG tablet Take 1 tablet by mouth daily  06/25/16  Yes Daysean Tinkham, PA-C   loratadine (CLARITIN) 10 MG tablet Take 1 PO Daily for allergic symptoms. 05/16/16  Yes Colin Broach Barlatt, DO   naproxen (NAPROSYN) 500 MG tablet Take 1 tablet by mouth 2 times daily (with meals) 08/10/15  Yes Mare Ferrari, MD     Social History     Social History   ??? Marital status: Single     Spouse name: N/A   ??? Number of children: N/A   ??? Years of education: N/A     Social History Main Topics   ??? Smoking status: Never Smoker   ??? Smokeless tobacco: Never Used   ??? Alcohol use No   ??? Drug use: No   ??? Sexual activity: Not Currently     Partners: Male     Other Topics Concern   ??? None     Social History Narrative       I have reviewed Christine Lynn's allergies, medications, problem list, medical, social and family history and have updated as needed in the electronic medical record    Review Of Systems:    Review of Systems   Constitutional: Negative for chills and fever.   Eyes: Negative for visual disturbance.   Respiratory: Negative for cough, shortness of breath and wheezing.  Cardiovascular: Negative for chest pain, palpitations and leg swelling.   Gastrointestinal: Negative for diarrhea, nausea and vomiting.   Genitourinary: Negative for dysuria and frequency.   Skin: Negative for rash.   Neurological: Negative for headaches.         OBJECTIVE:     VS:  Wt Readings from Last 3 Encounters:   06/25/16 274 lb (124.3 kg)   05/16/16 270 lb (122.5 kg)   12/05/15 278 lb (126.1 kg)                       Vitals:    06/25/16 1610   BP: 124/78   Pulse: 84   Resp: 20       Physical Exam   Constitutional: She is oriented to person, place, and time. She appears well-developed and well-nourished.   HENT:   Head: Normocephalic.   Neck: Neck supple. No thyromegaly present.   Cardiovascular: Normal rate, regular rhythm, normal heart sounds and intact distal pulses.  Exam reveals no gallop and no friction rub.    No murmur heard.  Pulmonary/Chest: Effort normal and breath sounds normal. No  respiratory distress. She has no wheezes. She has no rales.   Abdominal: Soft. Bowel sounds are normal. She exhibits no distension. There is no tenderness.   Musculoskeletal: She exhibits no edema.   Neurological: She is alert and oriented to person, place, and time.   Skin: Skin is warm and dry. No rash noted.        Results for orders placed or performed during the hospital encounter of 11/10/15   TSH without Reflex   Result Value Ref Range    TSH 1.490 0.270 - 4.200 uIU/mL   Lipid Panel   Result Value Ref Range    Cholesterol, Total 197 0 - 199 mg/dL    Triglycerides 37 0 - 149 mg/dL    HDL 57 >16>40 mg/dL    LDL Calculated 109133 (H) 0 - 99 mg/dL    VLDL CHOLESTEROL CALCULATED 7 mg/dL         ASSESSMENT/PLAN   Christine Lynn was seen today for annual exam and medication refill.    Diagnoses and all orders for this visit:    Wellness examination  -     Urine Drug Screen; Future  -     Measles/Mumps/Rubella Immunity; Future  -     Varicella Zoster Antibody, IgG; Future  -     Hepatitis B Surface Antibody; Future    Essential hypertension  -     Continue antihypertensive regimen  -     CBC; Future  -     Comprehensive Metabolic Panel; Future    Abnormal weight gain  -     Continue topiramate (TOPAMAX) 50 MG tablet; Take 1 tablet by mouth daily    Hypothyroidism (acquired)  -     levothyroxine (SYNTHROID) 125 MCG tablet; Take 1 tablet by mouth daily  -     Will adjust dose accordingly  -     TSH without Reflex; Future    Depression, unspecified depression type  -     LORazepam (ATIVAN) 0.5 MG tablet; Take 1 tablet by mouth 2 times daily as needed for Anxiety  -     Continue citalopram (CELEXA) 40 MG tablet; Take 1 tablet by mouth daily    Obesity, Class III, BMI 40-49.9 (morbid obesity) (HCC)        -     BMI was elevated today, and  weight loss plan recommended is : conventional weight loss.    Controlled Substances Monitoring: Attestation: The Prescription Monitoring Report for this patient was reviewed today. Dow Adolph(Sanja Elizardo,  PA-C)  Documentation: Possible medication side effects, risk of tolerance and/or dependence, and alternative treatments discussed, No signs of potential drug abuse or diversion identified. Dow Adolph(Brexlee Heberlein, PA-C)    Phone/MyChart follow up if tests abnormal.    Return in about 3 months (around 09/25/2016) for Hypertension.     I have reviewed my findings and recommendations with Christine Lynn.    Christine PuckerMariah Zarra Geffert, PA-C

## 2016-07-04 ENCOUNTER — Inpatient Hospital Stay: Attending: Physician Assistant | Primary: Family Medicine

## 2016-07-04 DIAGNOSIS — I10 Essential (primary) hypertension: Secondary | ICD-10-CM

## 2016-07-04 LAB — CBC
Hematocrit: 38.6 % (ref 34.0–48.0)
Hemoglobin: 12.6 g/dL (ref 11.5–15.5)
MCH: 28.2 pg (ref 26.0–35.0)
MCHC: 32.6 % (ref 32.0–34.5)
MCV: 86.4 fL (ref 80.0–99.9)
MPV: 10.8 fL (ref 7.0–12.0)
Platelets: 279 E9/L (ref 130–450)
RBC: 4.47 E12/L (ref 3.50–5.50)
RDW: 13.4 fL (ref 11.5–15.0)
WBC: 4.3 E9/L — ABNORMAL LOW (ref 4.5–11.5)

## 2016-07-04 LAB — COMPREHENSIVE METABOLIC PANEL
ALT: 16 U/L (ref 0–32)
AST: 17 U/L (ref 0–31)
Albumin: 4 g/dL (ref 3.5–5.2)
Alkaline Phosphatase: 71 U/L (ref 35–104)
Anion Gap: 14 mmol/L (ref 7–16)
BUN: 14 mg/dL (ref 6–20)
CO2: 28 mmol/L (ref 22–29)
Calcium: 9.8 mg/dL (ref 8.6–10.2)
Chloride: 101 mmol/L (ref 98–107)
Creatinine: 0.9 mg/dL (ref 0.5–1.0)
GFR African American: >60
GFR Non-African American: >60 mL/min/{1.73_m2} (ref >=60)
Glucose: 107 mg/dL (ref 74–109)
Potassium: 3.8 mmol/L (ref 3.5–5.0)
Sodium: 143 mmol/L (ref 132–146)
Total Bilirubin: 0.4 mg/dL (ref 0.0–1.2)
Total Protein: 7.7 g/dL (ref 6.4–8.3)

## 2016-07-04 LAB — TSH: TSH: 1.21 u[IU]/mL (ref 0.270–4.200)

## 2016-07-05 LAB — HEPATITIS B SURFACE ANTIBODY: Hep B S Ab: NONREACTIVE

## 2016-07-09 LAB — VARICELLA ZOSTER ANTIBODY, IGG

## 2016-07-10 LAB — MEASLES/MUMPS/RUBELLA IMMUNITY

## 2016-07-19 ENCOUNTER — Inpatient Hospital Stay: Admit: 2016-07-19 | Attending: Otolaryngology/Facial Plastic Surgery | Primary: Family Medicine

## 2016-07-19 ENCOUNTER — Encounter

## 2016-07-19 DIAGNOSIS — E042 Nontoxic multinodular goiter: Secondary | ICD-10-CM

## 2016-10-03 LAB — HEPATITIS B SURFACE ANTIBODY: Hep B S Ab: REACTIVE

## 2016-10-16 ENCOUNTER — Inpatient Hospital Stay: Attending: Physician Assistant | Primary: Family Medicine

## 2016-10-16 ENCOUNTER — Encounter

## 2016-10-16 DIAGNOSIS — Z0000 Encounter for general adult medical examination without abnormal findings: Secondary | ICD-10-CM

## 2016-10-16 NOTE — Progress Notes (Signed)
Per Delaney Meigsamara, patient went to have urine drug screen for work completed in June and she was not able to give enough for a sample. She states the order is expired and is in need of a new order. Order provided.

## 2016-10-17 LAB — URINE DRUG SCREEN
Amphetamine Screen, Urine: NOT DETECTED (ref <1000)
Barbiturate Screen, Ur: NOT DETECTED (ref <200)
Benzodiazepine Screen, Urine: NOT DETECTED (ref <200)
Cannabinoid Scrn, Ur: NOT DETECTED
Cocaine Metabolite Screen, Urine: NOT DETECTED (ref <300)
Methadone Screen, Urine: NOT DETECTED (ref <300)
Opiate Scrn, Ur: NOT DETECTED
PCP Screen, Urine: NOT DETECTED (ref <25)
Propoxyphene Scrn, Ur: NOT DETECTED (ref <300)

## 2016-10-24 ENCOUNTER — Ambulatory Visit
Admit: 2016-10-24 | Discharge: 2016-10-24 | Payer: PRIVATE HEALTH INSURANCE | Attending: Family Medicine | Primary: Family Medicine

## 2016-10-24 DIAGNOSIS — I10 Essential (primary) hypertension: Secondary | ICD-10-CM

## 2016-10-24 MED ORDER — TOPIRAMATE 50 MG PO TABS
50 | ORAL_TABLET | Freq: Every day | ORAL | 3 refills | Status: DC
Start: 2016-10-24 — End: 2017-11-18

## 2016-10-24 MED ORDER — THEREMS-H PO TABS
ORAL_TABLET | Freq: Every day | ORAL | 5 refills | Status: AC
Start: 2016-10-24 — End: ?

## 2016-10-24 MED ORDER — LORAZEPAM 0.5 MG PO TABS
0.5 | ORAL_TABLET | Freq: Two times a day (BID) | ORAL | 3 refills | Status: DC | PRN
Start: 2016-10-24 — End: 2017-01-20

## 2016-10-24 MED ORDER — AMLODIPINE BESYLATE 10 MG PO TABS
10 | ORAL_TABLET | Freq: Every day | ORAL | 5 refills | Status: DC
Start: 2016-10-24 — End: 2017-01-20

## 2016-10-24 MED ORDER — LEVOTHYROXINE SODIUM 125 MCG PO TABS
125 | ORAL_TABLET | Freq: Every day | ORAL | 5 refills | Status: DC
Start: 2016-10-24 — End: 2018-06-17

## 2016-10-24 MED ORDER — LISINOPRIL-HYDROCHLOROTHIAZIDE 20-25 MG PO TABS
20-25 | ORAL_TABLET | Freq: Every day | ORAL | 5 refills | Status: DC
Start: 2016-10-24 — End: 2017-10-09

## 2016-10-24 MED ORDER — CITALOPRAM HYDROBROMIDE 40 MG PO TABS
40 | ORAL_TABLET | Freq: Every day | ORAL | 5 refills | Status: DC
Start: 2016-10-24 — End: 2018-06-17

## 2016-10-24 NOTE — Patient Instructions (Signed)
Patient Education        Depression Treatment: Care Instructions  Your Care Instructions  Depression is a condition that affects the way you feel, think, and act. It causes symptoms such as low energy, loss of interest in daily activities, and sadness or grouchiness that goes on for a long time. Depression is very common and affects men and women of all ages.  Depression is a medical illness caused by changes in the natural chemicals in your brain. It is not a character flaw, and it does not mean that you are a bad or weak person. It does not mean that you are going crazy.  It is important to know that depression can be treated. Medicines, counseling, and self-care can all help. Many people do not get help because they are embarrassed or think that they will get over the depression on their own. But some people do not get better without treatment.  Follow-up care is a key part of your treatment and safety. Be sure to make and go to all appointments, and call your doctor if you are having problems. It's also a good idea to know your test results and keep a list of the medicines you take.  How can you care for yourself at home?  Learn about antidepressant medicines  Antidepressant medicines can improve or end the symptoms of depression. You may need to take the medicine for at least 6 months, and often longer. Keep taking your medicine even if you feel better. If you stop taking it too soon, your symptoms may come back or get worse.  You may start to feel better within 1 to 3 weeks of taking antidepressant medicine. But it can take as many as 6 to 8 weeks to see more improvement. Talk to your doctor if you have problems with your medicine or if you do not notice any improvement after 3 weeks.  Antidepressants can make you feel tired, dizzy, or nervous. Some people have dry mouth, constipation, headaches, sexual problems, an upset stomach, or diarrhea. Many of these side effects are mild and go away on their own  after you take the medicine for a few weeks. Some may last longer. Talk to your doctor if side effects bother you too much. You might be able to try a different medicine. If you are pregnant or breastfeeding, talk to your doctor about what medicines you can take.  Learn about counseling  In many cases, counseling can work as well as medicines to treat mild to moderate depression. Counseling is done by licensed mental health providers, such as psychologists, social workers, and some types of nurses. It can be done in one-on-one sessions or in a group setting. Many people find group sessions helpful.  Cognitive-behavioral therapy is a type of counseling. In this treatment therapy, you learn how to see and change unhelpful thinking styles that may be adding to your depression. Counseling and medicines often work well when used together.  To manage depression  ?? Be physically active. Getting 30 minutes of exercise each day is good for your body and your mind. Begin slowly if it is hard for you to get started. If you already exercise, keep it up.  ?? Plan something pleasant for yourself every day. Include activities that you have enjoyed in the past.  ?? Get enough sleep. Talk to your doctor if you have problems sleeping.  ?? Eat a balanced diet. If you do not feel hungry, eat small snacks rather than large   meals.  ?? Do not drink alcohol, use illegal drugs, or take medicines that your doctor has not prescribed for you. They may interfere with your treatment.  ?? Spend time with family and friends. It may help to speak openly about your depression with people you trust.  ?? Take your medicines exactly as prescribed. Call your doctor if you think you are having a problem with your medicine.  ?? Do not make major life decisions while you are depressed. Depression may change the way you think. You will be able to make better decisions after you feel better.  ?? Think positively. Challenge negative thoughts with statements such as  "I am hopeful"; "Things will get better"; and "I can ask for the help I need." Write down these statements and read them often, even if you don't believe them yet.  ?? Be patient with yourself. It took time for your depression to develop, and it will take time for your symptoms to improve. Do not take on too much or be too hard on yourself.  ?? Learn all you can about depression from written and online materials.  ?? Check out behavioral health classes to learn more about dealing with depression.  ?? Keep the numbers for these national suicide hotlines: 1-800-273-TALK (1-800-273-8255) and 1-800-SUICIDE (1-800-784-2433). If you or someone you know talks about suicide or feeling hopeless, get help right away.  When should you call for help?  Call 911 anytime you think you may need emergency care. For example, call if:  ?? You feel you cannot stop from hurting yourself or someone else.  Call your doctor now or seek immediate medical care if:  ?? You hear voices.  ?? You feel much more depressed.  Watch closely for changes in your health, and be sure to contact your doctor if:  ?? You are having problems with your depression medicine.  ?? You are not getting better as expected.  Where can you learn more?  Go to [email or web address] and sign in to your MyChart account. Enter G693 in the Search Health Information box to learn more about "Depression Treatment: Care Instructions."     If you do not have an account, please click on the "Sign Up Now" link.  Current as of: July 25, 2015  Content Version: 11.3  ?? 2006-2017 [employer]. Care instructions adapted under license by [facility]. If you have questions about a medical condition or this instruction, always ask your healthcare professional. [employer] disclaims any warranty or liability for your use of this information.

## 2016-10-24 NOTE — Progress Notes (Signed)
Chief Complaint   Patient presents with   ??? Hypertension     papers for school px has  hep B , LABS IN COMPUTER    ??? Medication Refill     DISCUSS MEDICATION       Patient is here for follow up for hypertension    Hypertension:  Patient is here for follow up chronic hypertension.  This is  generally controlled on current medication regimen.  BP today is 122/84.  Takes meds as directed and tolerates them well.  Most recent labs reviewed with patient and are not remarkable.  No symptoms from htn standpoint per ROS.  Patient is  compliant with lifestyle modifications.  Patient does not smoke.  Comorbid conditions include depression.      Patient has been feeling more stressed and depressed despite taking Celexa 40 and Ativan.      Patient having difficulty losing weight.      Patient's past medical, surgical, social and/or family history reviewed, updated in chart, and are non-contributory (unless otherwise stated).  Medications and allergies also reviewed and updated in chart.      BP 122/84 (Site: Left Arm, Cuff Size: Large Adult)    Pulse 72    Resp 20    Ht 5\' 7"  (1.702 m)    Wt 269 lb (122 kg)    LMP 09/30/2016    BMI 42.13 kg/m??     Review of Systems   Constitutional: Positive for fatigue.   Eyes: Negative for visual disturbance.   Respiratory: Negative for cough, shortness of breath and wheezing.    Cardiovascular: Negative for chest pain, palpitations and leg swelling.   Gastrointestinal: Negative for diarrhea, nausea and vomiting.   Genitourinary: Negative for difficulty urinating, dysuria and frequency.   Skin: Negative for rash.   Psychiatric/Behavioral: Positive for dysphoric mood. Negative for suicidal ideas. The patient is nervous/anxious.        Physical Exam   Constitutional: She is oriented to person, place, and time. She appears well-developed and well-nourished. No distress.   Neck: Neck supple. Carotid bruit is not present.   Cardiovascular: Normal rate, regular rhythm and normal heart sounds.  Exam  reveals no gallop.    No murmur heard.  Pulmonary/Chest: Effort normal and breath sounds normal. She has no wheezes. She has no rales.   Abdominal: Soft. Bowel sounds are normal. She exhibits no distension. There is no tenderness.   Musculoskeletal: She exhibits no edema.   Neurological: She is alert and oriented to person, place, and time.   Skin: Skin is warm and dry.   Psychiatric: She has a normal mood and affect.   Vitals reviewed.    Results for orders placed or performed during the hospital encounter of 10/16/16   Urine Drug Screen   Result Value Ref Range    Amphetamine Screen, Urine NOT DETECTED Negative <1000 ng/mL    Barbiturate Screen, Ur NOT DETECTED Negative < 200 ng/mL    Benzodiazepine Screen, Urine NOT DETECTED Negative < 200 ng/mL    Cannabinoid Scrn, Ur NOT DETECTED Negative < 50ng/mL    COCAINE METABOLITE SCREEN URINE NOT DETECTED Negative < 300 ng/mL    Opiate Scrn, Ur NOT DETECTED Negative < 300ng/mL    PCP Scrn, Ur NOT DETECTED Negative < 25 ng/mL    Methadone Screen, Urine NOT DETECTED Negative <300 ng/mL    Propoxyphene Scrn, Ur NOT DETECTED Negative <300 ng/mL       Maleigh was seen today for hypertension and  medication refill.    Diagnoses and all orders for this visit:    Essential hypertension  -     amLODIPine (NORVASC) 10 MG tablet; Take 1 tablet by mouth daily  -     lisinopril-hydrochlorothiazide (PRINZIDE;ZESTORETIC) 20-25 MG per tablet; Take 1 tablet by mouth daily    Moderate episode of recurrent major depressive disorder (HCC)  -     LORazepam (ATIVAN) 0.5 MG tablet; Take 1 tablet by mouth 2 times daily as needed for Anxiety  -     citalopram (CELEXA) 40 MG tablet; Take 1.5 tablets by mouth daily    Morbid obesity with BMI of 40.0-44.9, adult (HCC)  -     topiramate (TOPAMAX) 50 MG tablet; Take 1 tablet by mouth daily          BMI was elevated today, and weight loss plan recommended is : medically supervised diet with primary care physician.      Phone/MyChart follow up if tests  abnormal.  Return in about 6 weeks (around 12/05/2016) for depression., or sooner if necessary.      Educational materials and/or home exercises printed for patient's review and were included in patient instructions on his/her After Visit Summary and given to patient at the end of visit.      Counseled regarding above diagnosis, including possible risks and complications,  especially if left uncontrolled.    Counseled regarding the possible side effects, risks, benefits and alternatives to treatment; patient and/or guardian verbalizes understanding, agrees, feels comfortable with and wishes to proceed with above treatment plan.    Advised patient to call with any new medication issues, and read all Rx info from pharmacy to assure aware of all possible risks and side effects of medication before taking.    Reviewed age and gender appropriate health screening exams and vaccinations.  Advised patient regarding importance of keeping up with recommended health maintenance and to schedule as soon as possible if overdue, as this is important in assessing for undiagnosed pathology, especially cancer, as well as protecting against potentially harmful/life threatening disease.        Patient and/or guardian verbalizes understanding and agrees with above counseling, assessment and plan.    All questions answered.

## 2016-11-25 ENCOUNTER — Ambulatory Visit
Admit: 2016-11-25 | Discharge: 2016-11-25 | Payer: PRIVATE HEALTH INSURANCE | Attending: Family Medicine | Primary: Family Medicine

## 2016-11-25 DIAGNOSIS — G8929 Other chronic pain: Secondary | ICD-10-CM

## 2016-11-25 MED ORDER — DICLOFENAC SODIUM 50 MG PO TBEC
50 MG | ORAL_TABLET | Freq: Three times a day (TID) | ORAL | 3 refills | Status: DC | PRN
Start: 2016-11-25 — End: 2017-03-29

## 2016-11-25 NOTE — Patient Instructions (Signed)
Patient Education        Knee Pain or Injury: Care Instructions  Your Care Instructions    Injuries are a common cause of knee problems. Sudden (acute) injuries may be caused by a direct blow to the knee. They can also be caused by abnormal twisting, bending, or falling on the knee. Pain, bruising, or swelling may be severe, and may start within minutes of the injury.  Overuse is another cause of knee pain. Other causes are climbing stairs, kneeling, and other activities that use the knee. Everyday wear and tear, especially as you get older, also can cause knee pain.  Rest, along with home treatment, often relieves pain and allows your knee to heal. If you have a serious knee injury, you may need tests and treatment.  Follow-up care is a key part of your treatment and safety. Be sure to make and go to all appointments, and call your doctor if you are having problems. It's also a good idea to know your test results and keep a list of the medicines you take.  How can you care for yourself at home?  ?? Be safe with medicines. Read and follow all instructions on the label.  ?? If the doctor gave you a prescription medicine for pain, take it as prescribed.  ?? If you are not taking a prescription pain medicine, ask your doctor if you can take an over-the-counter medicine.  ?? Rest and protect your knee. Take a break from any activity that may cause pain.  ?? Put ice or a cold pack on your knee for 10 to 20 minutes at a time. Put a thin cloth between the ice and your skin.  ?? Prop up a sore knee on a pillow when you ice it or anytime you sit or lie down for the next 3 days. Try to keep it above the level of your heart. This will help reduce swelling.  ?? If your knee is not swollen, you can put moist heat, a heating pad, or a warm cloth on your knee.  ?? If your doctor recommends an elastic bandage, sleeve, or other type of support for your knee, wear it as directed.  ?? Follow your doctor's instructions about how much weight  you can put on your leg. Use a cane, crutches, or a walker as instructed.  ?? Follow your doctor's instructions about activity during your healing process. If you can do mild exercise, slowly increase your activity.  ?? Reach and stay at a healthy weight. Extra weight can strain the joints, especially the knees and hips, and make the pain worse. Losing even a few pounds may help.  When should you call for help?  Call 911 anytime you think you may need emergency care. For example, call if:  ?? You have symptoms of a blood clot in your lung (called a pulmonary embolism). These may include:  ?? Sudden chest pain.  ?? Trouble breathing.  ?? Coughing up blood.  Call your doctor now or seek immediate medical care if:  ?? You have severe or increasing pain.  ?? Your leg or foot turns cold or changes color.  ?? You cannot stand or put weight on your knee.  ?? Your knee looks twisted or bent out of shape.  ?? You cannot move your knee.  ?? You have signs of infection, such as:  ?? Increased pain, swelling, warmth, or redness.  ?? Red streaks leading from the knee.  ?? Pus draining from   a place on your knee.  ?? A fever.  ?? You have signs of a blood clot in your leg (called a deep vein thrombosis), such as:  ?? Pain in your calf, back of the knee, thigh, or groin.  ?? Redness and swelling in your leg or groin.  Watch closely for changes in your health, and be sure to contact your doctor if:  ?? You have tingling, weakness, or numbness in your knee.  ?? You have any new symptoms, such as swelling.  ?? You have bruises from a knee injury that last longer than 2 weeks.  ?? You do not get better as expected.  Where can you learn more?  Go to [email or web address] and sign in to your MyChart account. Enter K195 in the Search Health Information box to learn more about "Knee Pain or Injury: Care Instructions."     If you do not have an account, please click on the "Sign Up Now" link.  Current as of: March 18, 2016  Content Version:  11.3  ?? 2006-2017 [employer]. Care instructions adapted under license by [facility]. If you have questions about a medical condition or this instruction, always ask your healthcare professional. [employer] disclaims any warranty or liability for your use of this information.

## 2016-11-25 NOTE — Progress Notes (Signed)
[facility]   [address] [place], [address]   [place], [place] [id number]   [phone] [phone]   Kandis BanFareedah Goodwin-Capers, MD     Patient: Christine Lynn  Date of Birth: 21-Dec-1981  Visit Date: 11/25/16    [name] is a 35 y.o. year old female here today for bilateral knee pain  Chief Complaint   Patient presents with   ??? Knee Pain     right and left x awhile, wants referral to ortho       HPI  Knee Pain    There was no injury mechanism. The pain is present in the right knee and left knee. The quality of the pain is described as aching. The pain is at a severity of 10/10. The pain is severe. The pain has been constant since onset. Associated symptoms comments: +bilateral knee swelling. The symptoms are aggravated by movement. She has tried NSAIDs for the symptoms. The treatment provided no relief.   Patient has had intermittent right knee buckling. +crepitus.      Review of Systems   Eyes: Negative for visual disturbance.   Respiratory: Negative for cough, shortness of breath and wheezing.    Cardiovascular: Negative for chest pain, palpitations and leg swelling.   Gastrointestinal: Negative for diarrhea, nausea and vomiting.   Genitourinary: Negative for difficulty urinating, dysuria and frequency.   Musculoskeletal: Positive for arthralgias and joint swelling.   Skin: Negative for rash.       Past medical, surgical, social and/or family history reviewed, updated as needed, and are non-contributory (unless otherwise stated).  Medications, allergies, and problem list also reviewed and updated as needed in patient's record.    Wt Readings from Last 3 Encounters:   11/25/16 277 lb (125.6 kg)   10/24/16 269 lb (122 kg)   06/25/16 274 lb (124.3 kg)                   Vitals:    11/25/16 1705   BP: 118/68   Pulse: 84   Resp: 20   SpO2: 99%       Physical Exam   Constitutional: She is oriented to person, place, and time. She appears well-developed and well-nourished.   Neck: Neck supple.   Cardiovascular: Normal  rate, regular rhythm and normal heart sounds.  Exam reveals no gallop.    No murmur heard.  Pulmonary/Chest: Effort normal and breath sounds normal. She has no wheezes. She has no rales.   Abdominal: Soft. Bowel sounds are normal. She exhibits no distension. There is no tenderness.   Musculoskeletal: She exhibits tenderness (bilateral knees). She exhibits no edema.   Neurological: She is alert and oriented to person, place, and time.   Skin: Skin is warm and dry.   Psychiatric: She has a normal mood and affect.   Vitals reviewed.        ASSESSMENT/PLAN  Christine Lynn was seen today for knee pain.    Diagnoses and all orders for this visit:    Chronic pain of right knee  -     [facility] - Ernestine McmurrayWalton Jr., Melanee SpryIan, DO  -     XR Knee Right 3VW; Future  -     diclofenac (VOLTAREN) 50 MG EC tablet; Take 1 tablet by mouth 3 times daily as needed for Pain    Chronic pain of left knee  -     XR Knee Left 3VW; Future  -     diclofenac (VOLTAREN)  50 MG EC tablet; Take 1 tablet by mouth 3 times daily as needed for Pain          BMI was elevated today, and weight loss plan recommended is : conventional weight loss.      Phone/MyChart follow up if tests abnormal.  Return for scheduled appointment. or sooner if necessary.    I have reviewed my findings and recommendations with Christine Lynn.     Mare FerrariFareedah Z Goodwin-Capers, M.D

## 2016-11-26 ENCOUNTER — Ambulatory Visit: Admit: 2016-11-26 | Primary: Family Medicine

## 2016-11-26 ENCOUNTER — Inpatient Hospital Stay: Attending: Family Medicine | Primary: Family Medicine

## 2016-11-26 DIAGNOSIS — G8929 Other chronic pain: Secondary | ICD-10-CM

## 2016-12-11 ENCOUNTER — Encounter: Attending: Family Medicine | Primary: Family Medicine

## 2016-12-12 ENCOUNTER — Ambulatory Visit
Admit: 2016-12-12 | Discharge: 2016-12-12 | Payer: PRIVATE HEALTH INSURANCE | Attending: Orthopaedic Surgery | Primary: Family Medicine

## 2016-12-12 DIAGNOSIS — M170 Bilateral primary osteoarthritis of knee: Secondary | ICD-10-CM

## 2016-12-12 NOTE — Progress Notes (Signed)
Chief Complaint   Patient presents with   ??? Knee Pain     Bilateral knee pain with right > left. Pain for a couple years. complains of constant pain. No HOI. Patient has tried Mobic, Naproxen, and Voltaren with some relief.         HPI:    Patient is 35 y.o. female complaining chronic, atraumatic, insidious onset Bilateral knee pain with right worse than left for a couple years. She admits to stiffness, deep, aching pain, swelling, difficulty with stairs and ambulating far distances. She denies gross instability. Previous treatments include Mobic, Naproxen, and Voltaren with mild relief.      ROS:    Skin: (-) rash,(-) psoriasis,(-) eczema, (-)skin cancer.  Neurologic: (-)numbness, (-)tingling, (-)headaches, (-) LOC.  Cardiovascular: (-) Chest pain, (-) swelling in legs/feet, (-) SOB, (-) cramping in legs/feet with walking.    All other review of systems negative except stated above or in HPI      Past Medical History:   Diagnosis Date   ??? Abnormal Pap smear     2011   ??? Anxiety    ??? Depression    ??? Depression 03/28/2014   ??? Diabetes mellitus (HCC)     diet controlled   ??? Hypertension    ??? Hypothyroidism    ??? Obesity    ??? Thyroid disease     hypothyroid     No past surgical history on file.    Current Outpatient Prescriptions:   ???  diclofenac (VOLTAREN) 50 MG EC tablet, Take 1 tablet by mouth 3 times daily as needed for Pain, Disp: 90 tablet, Rfl: 3  ???  amLODIPine (NORVASC) 10 MG tablet, Take 1 tablet by mouth daily, Disp: 30 tablet, Rfl: 5  ???  lisinopril-hydrochlorothiazide (PRINZIDE;ZESTORETIC) 20-25 MG per tablet, Take 1 tablet by mouth daily, Disp: 30 tablet, Rfl: 5  ???  topiramate (TOPAMAX) 50 MG tablet, Take 1 tablet by mouth daily, Disp: 30 tablet, Rfl: 3  ???  levothyroxine (SYNTHROID) 125 MCG tablet, Take 1 tablet by mouth daily, Disp: 30 tablet, Rfl: 5  ???  LORazepam (ATIVAN) 0.5 MG tablet, Take 1 tablet by mouth 2 times daily as needed for Anxiety, Disp: 60 tablet, Rfl: 3  ???  citalopram (CELEXA) 40 MG tablet,  Take 1.5 tablets by mouth daily, Disp: 45 tablet, Rfl: 5  ???  Multiple Vitamins-Minerals (THERAPEUTIC MULTIVITAMIN-MINERALS W/ IRON) TABS tablet, Take 1 tablet by mouth daily, Disp: 30 tablet, Rfl: 5  ???  albuterol sulfate HFA (PROVENTIL HFA) 108 (90 Base) MCG/ACT inhaler, Inhale 2 puffs into the lungs every 6 hours as needed for Wheezing, Disp: 1 Inhaler, Rfl: 2  ???  loratadine (CLARITIN) 10 MG tablet, Take 1 PO Daily for allergic symptoms., Disp: 30 tablet, Rfl: 0  No Known Allergies  Social History     Social History   ??? Marital status: Single     Spouse name: N/A   ??? Number of children: N/A   ??? Years of education: N/A     Occupational History   ??? Not on file.     Social History Main Topics   ??? Smoking status: Never Smoker   ??? Smokeless tobacco: Never Used   ??? Alcohol use No   ??? Drug use: No   ??? Sexual activity: Not Currently     Partners: Male     Other Topics Concern   ??? Not on file     Social History Narrative   ??? No narrative on  file     Family History   Problem Relation Age of Onset   ??? Cancer Maternal Grandmother    ??? Cancer Maternal Grandfather    ??? Diabetes Mother    ??? Diabetes Other      aunts had complications from DM; deceased   ??? Cancer Other      1 sibling deceased from lung,throat cancer   ??? Diabetes Other      deceased            Physical Exam:    Ht 5\' 7"  (1.702 m)    Wt 277 lb (125.6 kg)    LMP 11/01/2016    BMI 43.38 kg/m??     GENERAL: alert, appears stated age, cooperative, no acute distress    HEENT: Head is normocephalic, atraumatic. PERRLA.     SKIN: Clean, dry, intact. There no cellulitis or cutaneous lesions noted in the lower extremities    PULMONARY: breathing is regular and unlabored, no acute distress    CV: The bilateral upper and lower extremities are warm and well-perfused with brisk capillary refill. 2+ pulses UE and LE bilateral.     PSYCHIATRY: Pleasant mood, appropriate behavior, follows commands    NEURO: Sensation is intact distally with light touch with no alteration. Motor exam  of the lower extremities show quadriceps, hamstrings, foot dorsiflexion and plantarflexion grossly intact 5/5.    LYMPH: No lymphedema present distally in upper or lower extremity.     MUSCULOSKELETAL:  Right Knee Exam:    mild effusion noted.  No erythema/induration/fluctuance. Patellar facets TTP.  Stable to varus and valgus at 0 and 30 degrees of flexion.  Negative Lachman's and posterior drawer. Negative patellar grind test and J sign. Compartments soft and compressible throughout leg.  Active range of motion 0-125 without pain.   Gait:Mild antalgic      Imaging:  Xr Knee Left 3vw    Result Date: 11/26/2016  LOCATION: 200 EXAM: XR KNEE LEFT 3VW, XR KNEE RIGHT 3VW COMPARISON: None HISTORY: Bilateral knee pain TECHNIQUE: 3 views of the left knee and 3 views of the right knee.  FINDINGS: Moderate tricompartmental degenerative changes are noted, greatest in the medial compartments bilaterally. There is no joint effusion. There is normal alignment. The patella is well located.  No fractures are identified.     Right knee: Moderate tricompartmental arthritis. Left knee: Moderate tricompartmental arthritis.    Xr Knee Right 3vw    Result Date: 11/26/2016  LOCATION: 200 EXAM: XR KNEE LEFT 3VW, XR KNEE RIGHT 3VW COMPARISON: None HISTORY: Bilateral knee pain TECHNIQUE: 3 views of the left knee and 3 views of the right knee.  FINDINGS: Moderate tricompartmental degenerative changes are noted, greatest in the medial compartments bilaterally. There is no joint effusion. There is normal alignment. The patella is well located.  No fractures are identified.     Right knee: Moderate tricompartmental arthritis. Left knee: Moderate tricompartmental arthritis.        [place] was seen today for knee pain.    Diagnoses and all orders for this visit:    Primary osteoarthritis of both knees      Patient is seeing good relief from oral Voltaren. Treatment options were discussed with the patient and she would like to stay the current  course with oral medications. Home exercises were distributed as well as prescription for physical therapy.    Return to clinic as needed      Fatima Blankan J Joanie Duprey, DO  12/12/16

## 2016-12-12 NOTE — Patient Instructions (Signed)
Patient Education        Knee Arthritis: Exercises  Your Care Instructions  Here are some examples of exercises for knee arthritis. Start each exercise slowly. Ease off the exercise if you start to have pain.  Your doctor or physical therapist will tell you when you can start these exercises and which ones will work best for you.  How to do the exercises  Knee flexion with heel slide    1. Lie on your back with your knees bent.  2. Slide your heel back by bending your affected knee as far as you can. Then hook your other foot around your ankle to help pull your heel even farther back.  3. Hold for about 6 seconds, then rest for up to 10 seconds.  4. Repeat 8 to 12 times.  5. Switch legs and repeat steps 1 through 4, even if only one knee is sore.  Quad sets    1. Sit with your affected leg straight and supported on the floor or a firm bed. Place a small, rolled-up towel under your knee. Your other leg should be bent, with that foot flat on the floor.  2. Tighten the thigh muscles of your affected leg by pressing the back of your knee down into the towel.  3. Hold for about 6 seconds, then rest for up to 10 seconds.  4. Repeat 8 to 12 times.  5. Switch legs and repeat steps 1 through 4, even if only one knee is sore.  Straight-leg raises to the front    1. Lie on your back with your good knee bent so that your foot rests flat on the floor. Your affected leg should be straight. Make sure that your low back has a normal curve. You should be able to slip your hand in between the floor and the small of your back, with your palm touching the floor and your back touching the back of your hand.  2. Tighten the thigh muscles in your affected leg by pressing the back of your knee flat down to the floor. Hold your knee straight.  3. Keeping the thigh muscles tight and your leg straight, lift your affected leg up so that your heel is about 12 inches off the floor. Hold for about 6 seconds, then lower slowly.  4. Relax for up  to 10 seconds between repetitions.  5. Repeat 8 to 12 times.  6. Switch legs and repeat steps 1 through 5, even if only one knee is sore.  Active knee flexion    1. Lie on your stomach with your knees straight. If your kneecap is uncomfortable, roll up a washcloth and put it under your leg just above your kneecap.  2. Lift the foot of your affected leg by bending the knee so that you bring the foot up toward your buttock. If this motion hurts, try it without bending your knee quite as far. This may help you avoid any painful motion.  3. Slowly move your leg up and down.  4. Repeat 8 to 12 times.  5. Switch legs and repeat steps 1 through 4, even if only one knee is sore.  Quadriceps stretch (facedown)    1. Lie flat on your stomach, and rest your face on the floor.  2. Wrap a towel or belt strap around the lower part of your affected leg. Then use the towel or belt strap to slowly pull your heel toward your buttock until you feel a stretch.    3. Hold for about 15 to 30 seconds, then relax your leg against the towel or belt strap.  4. Repeat 2 to 4 times.  5. Switch legs and repeat steps 1 through 4, even if only one knee is sore.  Stationary exercise bike    1. If you do not have a stationary exercise bike at home, you can find one to ride at your local health club or community center.  2. Adjust the height of the bike seat so that your knee is slightly bent when your leg is extended downward. If your knee hurts when the pedal reaches the top, you can raise the seat so that your knee does not bend as much.  3. Start slowly. At first, try to do 5 to 10 minutes of cycling with little to no resistance. Then increase your time and the resistance bit by bit until you can do 20 to 30 minutes without pain.  4. If you start to have pain, rest your knee until your pain gets back to the level that is normal for you. Or cycle for less time or with less effort.  Follow-up care is a key part of your treatment and safety. Be sure  to make and go to all appointments, and call your doctor if you are having problems. It's also a good idea to know your test results and keep a list of the medicines you take.  Where can you learn more?  Go to [email or web address] and sign in to your MyChart account. Enter C159 in the Search Health Information box to learn more about "Knee Arthritis: Exercises."     If you do not have an account, please click on the "Sign Up Now" link.  Current as of: March 19, 2016  Content Version: 11.4  ?? 2006-2017 [employer]. Care instructions adapted under license by [facility]. If you have questions about a medical condition or this instruction, always ask your healthcare professional. [employer] disclaims any warranty or liability for your use of this information.

## 2017-01-02 ENCOUNTER — Encounter

## 2017-01-20 ENCOUNTER — Ambulatory Visit
Admit: 2017-01-20 | Discharge: 2017-01-20 | Payer: PRIVATE HEALTH INSURANCE | Attending: Family Medicine | Primary: Family Medicine

## 2017-01-20 ENCOUNTER — Encounter

## 2017-01-20 DIAGNOSIS — I10 Essential (primary) hypertension: Secondary | ICD-10-CM

## 2017-01-20 LAB — LIPID PANEL
Chol/HDL Ratio: 3 NA
Cholesterol: 185 mg/dL (ref <200)
HDL: 59 mg/dL (ref 40–59)
LDL Cholesterol: 111 mg/dL — AB (ref <100)
Triglycerides: 75 mg/dL (ref <150)

## 2017-01-20 LAB — COMPREHENSIVE METABOLIC PANEL
ALT: 18 U/L (ref 12–78)
AST: 15 U/L (ref 15–37)
Albumin,Serum: 3.8 g/dL (ref 3.4–5.0)
Alkaline Phosphatase: 64 U/L (ref 45–117)
Anion Gap: 9 NA
BUN: 12 mg/dL (ref 7–25)
CO2: 26 mmol/L (ref 21–32)
Calcium: 9.4 mg/dL (ref 8.2–10.1)
Chloride: 108 mmol/L (ref 98–109)
Creatinine: 0.85 mg/dL (ref 0.55–1.40)
EGFR IF NonAfrican American: >60 mL/min (ref >60)
Glucose: 95 mg/dL (ref 70–100)
Potassium: 4.5 mmol/L (ref 3.5–5.1)
Sodium: 143 mmol/L (ref 135–145)
Total Bilirubin: 0.3 mg/dL (ref 0.2–1.0)
Total Protein: 7.6 g/dL (ref 6.4–8.2)
eGFR African American: >60 mL/min (ref >60)

## 2017-01-20 LAB — CBC
Hematocrit: 36.3 % (ref 35.0–47.0)
Hemoglobin: 11.9 g/dL (ref 11.7–16.0)
MCH: 28.8 pg (ref 26.0–34.0)
MCHC: 32.8 % (ref 32.0–36.0)
MCV: 87.8 fL (ref 79.0–98.0)
MPV: 10 fL (ref 7.4–10.4)
Platelets: 232 10*3/uL (ref 140–440)
RBC: 4.13 10*6/uL (ref 3.80–5.20)
RDW: 14.3 % (ref 11.5–14.5)
WBC: 6.3 10*3/uL (ref 3.6–10.7)

## 2017-01-20 LAB — TSH: TSH: 1.45 uU/mL (ref 0.358–3.740)

## 2017-01-20 LAB — VITAMIN D 25 HYDROXY: Vit D, 25-Hydroxy: 6 ng/mL — ABNORMAL LOW (ref 30–100)

## 2017-01-20 MED ORDER — AMLODIPINE BESYLATE 5 MG PO TABS
5 | ORAL_TABLET | Freq: Every day | ORAL | 5 refills | Status: DC
Start: 2017-01-20 — End: 2017-10-09

## 2017-01-20 NOTE — Progress Notes (Signed)
Subjective:     Patient: Christine Lynn is a 36 y.o. female    Agenda: Establish    Patient is here to establish:       Polyarthritis: She has been informed that bilateral knees are essentially bone on bone arthritis and that if she were older she would qualify for knee replacement she was informed that this would be improved by weight loss.       Anxiety: patient has been on citalopram 60 mg po qd for a year and has been offered lorazepam 0.5 mg po bid, but does not take due to sedation and need to be awake for her 75 year old daughter. Patient has noted that recently, anxiety levels have increased and are effecting focus.       Weight management: patient reports history of attempts with gen Topamax, which she did not continue due to sedation. Patient has also tried Adipex which lead to palpitations so she also stopped this.     Hypertension   The current episode started more than 1 year ago. Associated symptoms include peripheral edema (lower extremities - patient works on her feet). Pertinent negatives include no chest pain, headaches, palpitations or shortness of breath. Risk factors for coronary artery disease include obesity. Past treatments include ACE inhibitors, diuretics and calcium channel blockers. The current treatment provides significant improvement. There are no compliance problems.         Review of Systems   Constitutional: Negative for activity change, chills and fever.   HENT: Negative for congestion, dental problem, rhinorrhea and sore throat.    Eyes: Negative for pain, redness and visual disturbance.   Respiratory: Negative for chest tightness, shortness of breath and wheezing.    Cardiovascular: Negative for chest pain, palpitations and leg swelling.   Gastrointestinal: Negative for abdominal pain, constipation, diarrhea, nausea and vomiting.   Endocrine: Negative for polydipsia, polyphagia and polyuria.   Genitourinary: Negative for  dysuria.   Musculoskeletal: Negative for arthralgias and back pain.   Skin: Negative for rash.   Allergic/Immunologic: Negative for environmental allergies and food allergies.   Neurological: Negative for dizziness, syncope and headaches.   Psychiatric/Behavioral: Negative for decreased concentration, dysphoric mood and sleep disturbance. The patient is not hyperactive.         No Known Allergies  Current Outpatient Prescriptions on File Prior to Visit   Medication Sig Dispense Refill   ??? diclofenac (VOLTAREN) 50 MG EC tablet Take 1 tablet by mouth 3 times daily as needed for Pain 90 tablet 3   ??? lisinopril-hydrochlorothiazide (PRINZIDE;ZESTORETIC) 20-25 MG per tablet Take 1 tablet by mouth daily 30 tablet 5   ??? topiramate (TOPAMAX) 50 MG tablet Take 1 tablet by mouth daily 30 tablet 3   ??? levothyroxine (SYNTHROID) 125 MCG tablet Take 1 tablet by mouth daily 30 tablet 5   ??? citalopram (CELEXA) 40 MG tablet Take 1.5 tablets by mouth daily 45 tablet 5   ??? Multiple Vitamins-Minerals (THERAPEUTIC MULTIVITAMIN-MINERALS W/ IRON) TABS tablet Take 1 tablet by mouth daily 30 tablet 5   ??? albuterol sulfate HFA (PROVENTIL HFA) 108 (90 Base) MCG/ACT inhaler Inhale 2 puffs into the lungs every 6 hours as needed for Wheezing 1 Inhaler 2   ??? loratadine (CLARITIN) 10 MG tablet Take 1 PO Daily for allergic symptoms. 30 tablet 0     No current facility-administered medications on file prior to visit.       Past Medical History:   Diagnosis Date   ??? Abnormal  Pap smear     2011   ??? Anxiety    ??? Depression    ??? Depression 03/28/2014   ??? Diabetes mellitus (HCC)     diet controlled   ??? Hypertension    ??? Hypothyroidism    ??? Obesity    ??? Thyroid disease     hypothyroid      Social History   Substance Use Topics   ??? Smoking status: Never Smoker   ??? Smokeless tobacco: Never Used   ??? Alcohol use No          Objective:     BP 132/82    Ht 5\' 7"  (1.702 m)    Wt 279 lb (126.6 kg)    BMI 43.70 kg/m??     Physical Exam   Constitutional: She is  oriented to person, place, and time. She appears well-developed and well-nourished. No distress.   HENT:   Head: Normocephalic and atraumatic.   Right Ear: External ear normal.   Left Ear: External ear normal.   Nose: Nose normal.   Eyes: Conjunctivae and EOM are normal. Pupils are equal, round, and reactive to light.   Neck: Normal range of motion. Neck supple. No tracheal deviation present. No thyromegaly present.   Cardiovascular: Normal rate, regular rhythm, normal heart sounds and intact distal pulses.  Exam reveals no gallop and no friction rub.    No murmur heard.  Pulmonary/Chest: Effort normal and breath sounds normal. No respiratory distress. She has no wheezes. She has no rales.   Abdominal: Soft. Bowel sounds are normal. She exhibits no distension and no mass. There is no tenderness. There is no guarding.   Musculoskeletal: She exhibits no edema or deformity.   Lymphadenopathy:     She has no cervical adenopathy.   Neurological: She is alert and oriented to person, place, and time. She has normal reflexes. No cranial nerve deficit. She exhibits normal muscle tone. Coordination normal.   Skin: Skin is warm and dry. No rash noted. She is not diaphoretic. No erythema. No pallor.   Psychiatric: She has a normal mood and affect. Judgment and thought content normal.       Assessment      1. Essential hypertension    2. Polyarthritis    3. Hypothyroidism (acquired)    4. Thyroid goiter    5. Screening for diabetes mellitus    6. Screening for lipid disorders    7. Fatigue, unspecified type         Plan      1. Essential hypertension  - amLODIPine (NORVASC) 5 MG tablet; Take 1 tablet by mouth daily  Dispense: 30 tablet; Refill: 5  - CBC; Future    2. Polyarthritis    3. Hypothyroidism (acquired)  - US Thyroid; Future  - TSH without Reflex; Future    4. Thyroid goiter  - US Thyroid; Future  - TSH without Reflex; Future    5. Screening for diabetes mellitus  - Hemoglobin A1C; Future    6. Screening for lipid  disorders  - Comprehensive Metabolic Panel; Future  - Lipid Panel; Future    7. Fatigue, unspecified type  - Vitamin D 25 Hydroxy; Future      Loistine ChanceMorton Vyla Pint, DO  01/20/17  12:46 PM   Return in about 4 weeks (around 02/17/2017) for Recheck Blood Pressure.           Diabetes a1c <9: not applicable  DM microalbumin/cr ratio within 1 year: not applicable  Hypertension controlled: BP goal<140/<90 mgHg    yes  Pneumonia vaccine Up to date: not applicable  Colon cancer screening up to date: not applicable  Breast cancer screening up to date: not applicable    Most recent labs reviewed today No  I spent over 51% total time 45 minutes counseling (or coordinating care) and provided discussion regarding review of hypertension management of hypertension medication changes reason for reduction of amlodipine and close follow-up.  Anxiety management of anxiety and also talked about in the future possibly changing her daily medication for anxiety control citalopram because of the class has a awakening side effect risk.  Weight management I did talk about both medical bariatrics and surgical bariatrics as possible options this point we will continue to discuss this is with him build our patient physician working relationship.  Also talked about topically trying generic Voltaren gel instead of diclofenac oral Cipro and we might reduce gastrointestinal wrist slightly and also maybe just treat the topical treatment the pain topically of her knees.  We'll follow-up in one month. Patient verbalized understanding, agreed with plan and thanked me for my time. Will follow.      Dictated using Dragon Naturally Speaking Medical Version 2.4  Proof read however unrecognized voice recognition errors may have occurred

## 2017-01-21 LAB — HEMOGLOBIN A1C
Hemoglobin A1C: 5.9 % — ABNORMAL HIGH (ref 4.0–5.6)
eAG: 123 mg/dL

## 2017-02-15 ENCOUNTER — Inpatient Hospital Stay
Admit: 2017-02-15 | Discharge: 2017-02-15 | Disposition: A | Discharge status: Home / Self Care | Attending: Emergency Medicine

## 2017-02-15 DIAGNOSIS — S161XXA Strain of muscle, fascia and tendon at neck level, initial encounter: Secondary | ICD-10-CM

## 2017-02-15 MED ORDER — ORPHENADRINE CITRATE ER 100 MG PO TB12
100 MG | ORAL_TABLET | Freq: Two times a day (BID) | ORAL | 0 refills | Status: AC
Start: 2017-02-15 — End: 2017-02-25

## 2017-02-15 MED ORDER — NAPROXEN SODIUM 550 MG PO TABS
550 MG | ORAL_TABLET | Freq: Two times a day (BID) | ORAL | 0 refills | Status: DC
Start: 2017-02-15 — End: 2017-03-29

## 2017-02-15 MED ORDER — HYDROCODONE-ACETAMINOPHEN 5-325 MG PO TABS
5-325 MG | ORAL_TABLET | Freq: Four times a day (QID) | ORAL | 0 refills | Status: AC | PRN
Start: 2017-02-15 — End: 2017-02-18

## 2017-02-15 NOTE — ED Provider Notes (Signed)
HPI:   Christine LeysLinnise A Un is a 36 y.o. female presenting to the ED for pain in the side of her neck extending anteriorly in the strap muscles and partially into the trapezius.  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  She denies any radiation of pain in the midline pain in the back or any other injuries.  ROS:   Unless otherwise stated in this report or unable to obtain because of the patient's clinical or mental status as evidenced by the medical record, this patients's positive and negative responses for Review of Systems, constitutional, psych, eyes, ENT, cardiovascular, respiratory, gastrointestinal, neurological, genitourinary, musculoskeletal, integument systems and systems related to the presenting problem are either stated in the preceding or were not pertinent or were negative for the symptoms and/or complaints related to the medical problem.      --------------------------------------------- PAST HISTORY ---------------------------------------------  Past Medical History:  has a past medical history of Abnormal Pap smear; Anxiety; Depression; Depression; Diabetes mellitus (HCC); Hypertension; Hypothyroidism; Obesity; and Thyroid disease.    Past Surgical History:  has no past surgical history on file.    Social History:  reports that she has never smoked. She has never used smokeless tobacco. She reports that she does not drink alcohol or use drugs.    Family History: family history includes Cancer in her maternal grandfather, maternal grandmother, and another family member; Diabetes in her mother and other family members.     The patient???s home medications have been reviewed.    Allergies: Review of patient's allergies indicates no known allergies.    -------------------------------------------------- RESULTS -------------------------------------------------  All laboratory and radiology results have been personally reviewed  by myself   LABS:  No results found for this visit on 02/15/17.    RADIOLOGY:  Interpreted by Radiologist.  No orders to display       ------------------------- NURSING NOTES AND VITALS REVIEWED ---------------------------   The nursing notes within the ED encounter and vital signs as below have been reviewed.   BP 132/78    Pulse 98    Temp 98.5 ??F (36.9 ??C) (Oral)    Resp 18    Ht 5\' 7"  (1.702 m)    Wt 280 lb (127 kg)    LMP 01/27/2017    SpO2 99%    BMI 43.85 kg/m??   Oxygen Saturation Interpretation: Normal      ---------------------------------------------------PHYSICAL EXAM--------------------------------------      Constitutional/General: Alert and oriented x3, well appearing, non toxic in NAD  Head: NC/AT  Eyes: PERRL, EOMI  Mouth: Oropharynx clear, handling secretions, no trismus  Neck: Supple, full ROM, however tender strap muscles anteriorly and trapezius posteriorly.  No spasm appreciated.  No midline cervical pain.  Pulmonary: Lungs clear to auscultation bilaterally, no wheezes, rales, or rhonchi. Not in respiratory distress  Cardiovascular:  Regular rate and rhythm, no murmurs, gallops, or rubs. 2+ distal pulses  Abdomen: Soft, non tender, non distended,   Extremities: Moves all extremities x 4. Warm and well perfused  Skin: warm and dry without rash  Neurologic: GCS 15,  Psych: Normal Affect      ------------------------------ ED COURSE/MEDICAL DECISION MAKING----------------------  Medications - No data to display      Medical Decision Making:    36 year old status post soft tissue injury to her neck and trapezius.  We'll place her on anti-inflammatories and muscle relaxer and give her the day before.  She began small  amount of pain medication.    Counseling:   The emergency provider has spoken with the patient and discussed today???s results, in addition to providing specific details for the plan of care and counseling regarding the diagnosis and prognosis.  Questions are answered at this time and they  are agreeable with the plan.      --------------------------------- IMPRESSION AND DISPOSITION ---------------------------------    IMPRESSION  1. Strain of neck muscle, initial encounter        DISPOSITION  Disposition: Discharge to home  Patient condition is stable              Juanna Cao, MD  02/15/17 [phone]

## 2017-02-20 ENCOUNTER — Inpatient Hospital Stay: Admit: 2017-02-20 | Attending: Family Medicine | Primary: Family Medicine

## 2017-02-20 DIAGNOSIS — E049 Nontoxic goiter, unspecified: Secondary | ICD-10-CM

## 2017-02-24 ENCOUNTER — Ambulatory Visit
Admit: 2017-02-24 | Discharge: 2017-02-24 | Payer: PRIVATE HEALTH INSURANCE | Attending: Family Medicine | Primary: Family Medicine

## 2017-02-24 DIAGNOSIS — I10 Essential (primary) hypertension: Secondary | ICD-10-CM

## 2017-02-24 MED ORDER — METHYLPHENIDATE HCL ER 27 MG PO TB24
27 MG | ORAL_TABLET | Freq: Every morning | ORAL | 0 refills | Status: DC
Start: 2017-02-24 — End: 2017-03-24

## 2017-02-24 NOTE — Progress Notes (Signed)
Subjective:     Patient: Christine Lynn is a 36 y.o. female    Agenda: HTN      ADD/ADHD Symptoms:  Patient complains of a 20+ year(s) history of inattention, impulsivity, including the following: fails to give close attention to details or makes careless mistakes in school, work, or other activities, has difficulty sustaining attention in tasks or play activities, has difficulty organizing tasks and activities, loses things that are necessary for tasks and activities, is easily distracted by extraneous stimuli and avoids engaging in tasks that require sustained attention. She presents for evaluation of suspected ADD/ADHD.  Patient denies none.  Symptoms have been gradually worsening with time.  Family history of ADD/ADHD: yes - sister.  Previous treatment:has included: none.          Hypertension   This is a chronic problem. The current episode started more than 1 year ago. The problem is controlled. Pertinent negatives include no chest pain, headaches, palpitations or shortness of breath. Past treatments include ACE inhibitors, diuretics and calcium channel blockers. The current treatment provides significant improvement. There are no compliance problems.         Review of Systems   Constitutional: Negative for activity change, chills and fever.   HENT: Negative for congestion, dental problem, rhinorrhea and sore throat.    Eyes: Negative for pain, redness and visual disturbance.   Respiratory: Negative for chest tightness, shortness of breath and wheezing.    Cardiovascular: Negative for chest pain, palpitations and leg swelling.   Gastrointestinal: Negative for abdominal pain, constipation, diarrhea, nausea and vomiting.   Endocrine: Negative for polydipsia, polyphagia and polyuria.   Genitourinary: Negative for dysuria.   Musculoskeletal: Negative for arthralgias and back pain.   Skin: Negative for rash.   Allergic/Immunologic: Negative for environmental  allergies and food allergies.   Neurological: Negative for dizziness, syncope and headaches.   Psychiatric/Behavioral: Negative for decreased concentration, dysphoric mood and sleep disturbance. The patient is not hyperactive.         Affect is congruent with mood        No Known Allergies  Current Outpatient Prescriptions on File Prior to Visit   Medication Sig Dispense Refill   ??? orphenadrine (NORFLEX) 100 MG extended release tablet Take 1 tablet by mouth 2 times daily for 10 days 20 tablet 0   ??? naproxen sodium (ANAPROX DS) 550 MG tablet Take 1 tablet by mouth 2 times daily (with meals) for 10 days 20 tablet 0   ??? amLODIPine (NORVASC) 5 MG tablet Take 1 tablet by mouth daily 30 tablet 5   ??? diclofenac (VOLTAREN) 50 MG EC tablet Take 1 tablet by mouth 3 times daily as needed for Pain 90 tablet 3   ??? lisinopril-hydrochlorothiazide (PRINZIDE;ZESTORETIC) 20-25 MG per tablet Take 1 tablet by mouth daily 30 tablet 5   ??? topiramate (TOPAMAX) 50 MG tablet Take 1 tablet by mouth daily 30 tablet 3   ??? levothyroxine (SYNTHROID) 125 MCG tablet Take 1 tablet by mouth daily 30 tablet 5   ??? citalopram (CELEXA) 40 MG tablet Take 1.5 tablets by mouth daily 45 tablet 5   ??? Multiple Vitamins-Minerals (THERAPEUTIC MULTIVITAMIN-MINERALS W/ IRON) TABS tablet Take 1 tablet by mouth daily 30 tablet 5   ??? albuterol sulfate HFA (PROVENTIL HFA) 108 (90 Base) MCG/ACT inhaler Inhale 2 puffs into the lungs every 6 hours as needed for Wheezing 1 Inhaler 2   ??? loratadine (CLARITIN) 10 MG tablet Take 1 PO Daily for allergic symptoms.  30 tablet 0     No current facility-administered medications on file prior to visit.       Past Medical History:   Diagnosis Date   ??? Abnormal Pap smear     2011   ??? Anxiety    ??? Depression    ??? Depression 03/28/2014   ??? Diabetes mellitus (HCC)     diet controlled   ??? Hypertension    ??? Hypothyroidism    ??? Obesity    ??? Thyroid disease     hypothyroid      Social History   Substance Use Topics   ??? Smoking status: Never  Smoker   ??? Smokeless tobacco: Never Used   ??? Alcohol use No          Objective:     BP 116/78    Pulse 76    Ht 5\' 7"  (1.702 m)    Wt 276 lb (125.2 kg)    LMP 01/27/2017    SpO2 99%    BMI 43.23 kg/m??     Physical Exam   Constitutional: She is oriented to person, place, and time. She appears well-developed and well-nourished.   HENT:   Head: Normocephalic and atraumatic.   Eyes: EOM are normal. Pupils are equal, round, and reactive to light.   Cardiovascular: Normal rate, regular rhythm, normal heart sounds and intact distal pulses.  Exam reveals no gallop and no friction rub.    No murmur heard.  Pulmonary/Chest: Effort normal and breath sounds normal. No respiratory distress. She has no wheezes. She has no rales.   Abdominal: Soft. Bowel sounds are normal. She exhibits no distension and no mass. There is no tenderness. There is no rebound and no guarding.   Neurological: She is alert and oriented to person, place, and time.   Psychiatric: She has a normal mood and affect. Judgment normal.       Assessment      1. Essential hypertension    2. Attention deficit hyperactivity disorder (ADHD), combined type         Plan      1. Essential hypertension    2. Attention deficit hyperactivity disorder (ADHD), combined type  - methylphenidate (CONCERTA) 27 MG CR tablet; Take 1 tablet by mouth every morning for 30 days.  Dispense: 30 tablet; Refill: 0      Loistine Chance, DO  02/24/17  5:40 PM   Return in about 4 weeks (around 03/24/2017) for Recheck ADHD.           Diabetes a1c <9: not applicable  DM microalbumin/cr ratio within 1 year: not applicable  Hypertension controlled: BP goal<140/<90 mgHg    yes  Pneumonia vaccine Up to date: not applicable  Colon cancer screening up to date: not applicable  Breast cancer screening up to date: not applicable    Most recent labs reviewed today Yes  Dictated using Dragon Naturally Speaking Medical Version 2.4  Proof read however unrecognized voice recognition errors may have  occurred

## 2017-02-25 NOTE — Telephone Encounter (Signed)
Unable to notify pt her VM is not set up and no answer.

## 2017-02-25 NOTE — Telephone Encounter (Signed)
PA was done. Response within 72 hours.

## 2017-02-25 NOTE — Telephone Encounter (Signed)
PA approved.

## 2017-02-25 NOTE — Telephone Encounter (Signed)
Pt called back and made aware

## 2017-03-05 ENCOUNTER — Inpatient Hospital Stay: Attending: Rehabilitative and Restorative Service Providers" | Primary: Family Medicine

## 2017-03-24 ENCOUNTER — Ambulatory Visit
Admit: 2017-03-24 | Discharge: 2017-03-24 | Payer: PRIVATE HEALTH INSURANCE | Attending: Family Medicine | Primary: Family Medicine

## 2017-03-24 DIAGNOSIS — F902 Attention-deficit hyperactivity disorder, combined type: Secondary | ICD-10-CM

## 2017-03-24 MED ORDER — METHYLPHENIDATE HCL ER 27 MG PO TB24
27 MG | ORAL_TABLET | Freq: Every morning | ORAL | 0 refills | Status: DC
Start: 2017-03-24 — End: 2017-03-29

## 2017-03-24 MED ORDER — METHYLPHENIDATE HCL ER 27 MG PO TB24
27 | ORAL_TABLET | Freq: Every morning | ORAL | 0 refills | Status: DC
Start: 2017-03-24 — End: 2017-04-16

## 2017-03-24 NOTE — Progress Notes (Signed)
Subjective:     Patient: Christine Lynn is a 36 y.o. female    Agenda: ADHD f/u    The patient presents today for follow up regarding: ADHD      Patient is here to follow up on ADHD. Patient is currently taking: concerta 27 mg po daily. Patient reports medication is working well without side effect of palpitation, insomnia or irritability. Patient is requesting refill, today.              Review of Systems   Respiratory: Negative for shortness of breath and wheezing.    Cardiovascular: Negative for chest pain and palpitations.   Gastrointestinal: Negative for abdominal pain, constipation, diarrhea, nausea and vomiting.   Psychiatric/Behavioral:        Affect is congruent with mood        No Known Allergies  Current Outpatient Prescriptions on File Prior to Visit   Medication Sig Dispense Refill   . naproxen sodium (ANAPROX DS) 550 MG tablet Take 1 tablet by mouth 2 times daily (with meals) for 10 days 20 tablet 0   . amLODIPine (NORVASC) 5 MG tablet Take 1 tablet by mouth daily 30 tablet 5   . diclofenac (VOLTAREN) 50 MG EC tablet Take 1 tablet by mouth 3 times daily as needed for Pain 90 tablet 3   . lisinopril-hydrochlorothiazide (PRINZIDE;ZESTORETIC) 20-25 MG per tablet Take 1 tablet by mouth daily 30 tablet 5   . topiramate (TOPAMAX) 50 MG tablet Take 1 tablet by mouth daily 30 tablet 3   . levothyroxine (SYNTHROID) 125 MCG tablet Take 1 tablet by mouth daily 30 tablet 5   . citalopram (CELEXA) 40 MG tablet Take 1.5 tablets by mouth daily 45 tablet 5   . Multiple Vitamins-Minerals (THERAPEUTIC MULTIVITAMIN-MINERALS W/ IRON) TABS tablet Take 1 tablet by mouth daily 30 tablet 5   . loratadine (CLARITIN) 10 MG tablet Take 1 PO Daily for allergic symptoms. 30 tablet 0   . albuterol sulfate HFA (PROVENTIL HFA) 108 (90 Base) MCG/ACT inhaler Inhale 2 puffs into the lungs every 6 hours as needed for Wheezing 1 Inhaler 2     No current facility-administered  medications on file prior to visit.       Past Medical History:   Diagnosis Date   . Abnormal Pap smear     2011   . Anxiety    . Depression    . Depression 03/28/2014   . Diabetes mellitus (HCC)     diet controlled   . Hypertension    . Hypothyroidism    . Obesity    . Thyroid disease     hypothyroid      Social History   Substance Use Topics   . Smoking status: Never Smoker   . Smokeless tobacco: Never Used   . Alcohol use No          Objective:     BP 124/78   Ht 5\' 7"  (1.702 m)   Wt 278 lb (126.1 kg)   LMP 03/19/2017   BMI 43.54 kg/m     Physical Exam   Constitutional: She is oriented to person, place, and time. She appears well-developed and well-nourished.   HENT:   Head: Normocephalic and atraumatic.   Eyes: EOM are normal. Pupils are equal, round, and reactive to light.   Cardiovascular: Normal rate, regular rhythm, normal heart sounds and intact distal pulses.  Exam reveals no gallop and no friction rub.    No murmur heard.  Pulmonary/Chest: Effort normal and breath sounds normal. No respiratory distress. She has no wheezes. She has no rales.   Abdominal: Soft. Bowel sounds are normal. She exhibits no distension and no mass. There is no tenderness. There is no rebound and no guarding.   Neurological: She is alert and oriented to person, place, and time.   Psychiatric: She has a normal mood and affect. Judgment normal.       Assessment      1. Attention deficit hyperactivity disorder (ADHD), combined type         Plan      1. Attention deficit hyperactivity disorder (ADHD), combined type  - methylphenidate (CONCERTA) 27 MG CR tablet; Take 1 tablet by mouth every morning for 30 days. Earliest Fill Date: 03/24/17  Dispense: 30 tablet; Refill: 0  - methylphenidate (CONCERTA) 27 MG CR tablet; Take 1 tablet by mouth every morning for 30 days. Earliest Fill Date: 05/21/17  Dispense: 30 tablet; Refill: 0  - methylphenidate (CONCERTA) 27 MG CR tablet; Take 1 tablet by mouth every morning for 30 days. Earliest Fill  Date: 04/21/17  Dispense: 30 tablet; Refill: 0      Loistine Chance, DO  03/24/17  7:47 AM   Return in about 3 months (around 06/24/2017) for Recheck ADHD.             Most recent labs reviewed today Yes  Dictated using Dragon Naturally Speaking Medical Version 2.4  Proof read however unrecognized voice recognition errors may have occurred

## 2017-03-29 ENCOUNTER — Inpatient Hospital Stay
Admit: 2017-03-29 | Discharge: 2017-03-29 | Disposition: A | Discharge status: Home / Self Care | Payer: PRIVATE HEALTH INSURANCE | Attending: Emergency Medicine

## 2017-03-29 DIAGNOSIS — J00 Acute nasopharyngitis [common cold]: Secondary | ICD-10-CM

## 2017-03-29 MED ORDER — PSEUDOEPH-BROMPHEN-DM 30-2-10 MG/5ML PO SYRP
2-30-10 MG/5ML | Freq: Four times a day (QID) | ORAL | 0 refills | Status: DC | PRN
Start: 2017-03-29 — End: 2017-05-31

## 2017-03-29 MED ORDER — BENZONATATE 200 MG PO CAPS
200 MG | ORAL_CAPSULE | ORAL | 1 refills | Status: AC
Start: 2017-03-29 — End: 2017-04-05

## 2017-03-29 MED ORDER — LORATADINE 10 MG PO TABS
10 MG | ORAL_TABLET | ORAL | 1 refills | Status: AC
Start: 2017-03-29 — End: ?

## 2017-03-29 NOTE — ED Provider Notes (Addendum)
Chief Complaint   Patient presents with   . Cough     dry cough with chest hurting   : had concerns including Cough (dry cough with chest hurting).    The history is provided by the patient.   Cough   Cough characteristics:  Non-productive  Severity:  Moderate  Onset quality:  Unable to specify  Duration:  3 days  Timing:  Rare  Progression:  Unchanged  Chronicity:  New  Smoker: no    Context: occupational exposure    Relieved by:  None tried  Worsened by:  Nothing  Ineffective treatments:  None tried  Associated symptoms: chest pain (From coughing)    Associated symptoms: no chills, no ear pain, no eye discharge, no fever, no headaches, no rash, no shortness of breath, no sore throat and no wheezing        Review of Systems   Constitutional: Negative for chills and fever.   HENT: Positive for congestion. Negative for ear pain, sinus pressure and sore throat.    Eyes: Negative for pain, discharge and redness.   Respiratory: Positive for cough. Negative for shortness of breath and wheezing.    Cardiovascular: Positive for chest pain (From coughing).   Gastrointestinal: Negative for abdominal distention, diarrhea, nausea and vomiting.   Genitourinary: Negative for dysuria and frequency.   Musculoskeletal: Negative for arthralgias and back pain.   Skin: Negative for rash and wound.   Neurological: Negative for weakness and headaches.   Hematological: Negative for adenopathy.   All other systems reviewed and are negative.      Physical Exam   Constitutional: She is oriented to person, place, and time. She appears well-developed and well-nourished.   HENT:   Head: Normocephalic and atraumatic.   Right Ear: Tympanic membrane, external ear and ear canal normal.   Left Ear: Tympanic membrane, external ear and ear canal normal.   Nose: Rhinorrhea present.   Mouth/Throat: Uvula is midline, oropharynx is clear and moist and mucous membranes are normal.   Eyes: Conjunctivae and EOM are normal. Pupils are equal, round, and  reactive to light.   Neck: Normal range of motion. Neck supple. No thyromegaly present.   Cardiovascular: Normal rate, regular rhythm and normal heart sounds.    No murmur heard.  Pulmonary/Chest: Effort normal and breath sounds normal. No respiratory distress. She has no decreased breath sounds. She has no wheezes. She has no rhonchi. She has no rales.   Abdominal: Soft. Bowel sounds are normal. There is no tenderness. There is no rebound and no guarding.   Musculoskeletal: She exhibits no edema.   Lymphadenopathy:     She has no cervical adenopathy.   Neurological: She is alert and oriented to person, place, and time. No cranial nerve deficit. Coordination normal.   Skin: Skin is warm and dry.   Nursing note and vitals reviewed.      Procedures    MDM    ED Course          --------------------------------------------- PAST HISTORY ---------------------------------------------  Past Medical History:  has a past medical history of Abnormal Pap smear; Anxiety; Depression; Depression; Diabetes mellitus (HCC); Hypertension; Hypothyroidism; Obesity; and Thyroid disease.    Past Surgical History:  has no past surgical history on file.    Social History:  reports that she has never smoked. She has never used smokeless tobacco. She reports that she does not drink alcohol or use drugs.    Family History: family history includes Cancer in her  maternal grandfather, maternal grandmother, and another family member; Diabetes in her mother and other family members.     The patient's home medications have been reviewed.    Allergies: Patient has no known allergies.    -------------------------------------------------- RESULTS -------------------------------------------------  No results found for this visit on 03/29/17.  No orders to display       ------------------------- NURSING NOTES AND VITALS REVIEWED ---------------------------   The nursing notes within the ED encounter and vital signs as below have been reviewed.   BP  136/87   Pulse 80   Temp 98.5 F (36.9 C) (Oral)   Resp 18   Ht 5\' 7"  (1.702 m)   Wt 280 lb (127 kg)   LMP 03/19/2017   SpO2 98%   BMI 43.85 kg/m   Oxygen Saturation Interpretation: Normal    DIAGNOSIS : Acute nasopharyngitis.  ------------------------------------------ PROGRESS NOTES ------------------------------------------   I have spoken with the patient and discussed today's results, in addition to providing specific details for the plan of care and counseling regarding the diagnosis and prognosis.  Their questions are answered at this time and they are agreeable with the plan.      --------------------------------- ADDITIONAL PROVIDER NOTES ---------------------------------     This patient is stable for discharge.  I have shared the specific conditions for return, as well as the importance of follow-up.       Irving Shows, DO  03/29/17 1914       Irving Shows, DO  04/07/17 1207

## 2017-04-07 NOTE — Telephone Encounter (Signed)
Pt states she changed her insurance to [employer] and needs a new pa. Is this maybe the fax you got today?

## 2017-04-07 NOTE — Telephone Encounter (Signed)
We do not have her new card on file I called and LMOM for pt to call the office so we can update it. I am unable to do PA without this information.

## 2017-04-08 NOTE — Telephone Encounter (Signed)
Information has been updated in the chart.  I attempted PA on cover my meds and I received a message to call ins. [phone].  I called [employer] to do PA and was told doing a PA is not an option this med is not covered.   I will call and inform pt.

## 2017-04-08 NOTE — Telephone Encounter (Signed)
I called and informed pt and she would like to know what other meds she can try.

## 2017-04-08 NOTE — Telephone Encounter (Signed)
Patient needs to check with insurance to find out what medicines for ADHD are covered

## 2017-04-08 NOTE — Telephone Encounter (Signed)
Patient called stated that you needed her insurance information for prior auth for her medication.  I updated [employer] insurance information on patient chart.

## 2017-04-09 ENCOUNTER — Telehealth

## 2017-04-09 NOTE — Telephone Encounter (Signed)
Pt called and stated she has been so stresses out without her concerta and trying to deal with insurance on filling it that she missed work today and is requesting a work note for 04/09/17 is this ok to do?

## 2017-04-09 NOTE — Telephone Encounter (Signed)
Pt called and stated she is still covered under her old insurance at this time and will fill the concerta with them.

## 2017-04-09 NOTE — Telephone Encounter (Signed)
Ok to do.

## 2017-04-10 NOTE — Telephone Encounter (Signed)
Mailed per pt request

## 2017-04-10 NOTE — Telephone Encounter (Signed)
Letter completed I will call pt and see if it should be faxed mailed or is she picking it up?

## 2017-04-15 NOTE — Telephone Encounter (Signed)
The above patient called and need to get a TB test for school and can you please ok for her to come in a get a TB placement and she wanted to let you know that she has been off her concerta for 3 weeks and she is having trouble getting it at the pharmacy.  The pharmacist RAY says there must be something wrong with the way the prescription was written or the prior auth, and I'm so confused with talking with him.  He keeps taking about a generic and I said the generic is approved on the prior that we have and he keeps says that he can't get it.  Since she has not been on this medication she has been so stressed out with work and school and her job will not let her take off.  She is so stressed out with work and school that she would like to take time off work until this semester of school is over.  She has been off work since 04-12-17 and can return to work on 05-06-17.  Can you please write her a note to take her off work for the date listed above. She will try to see if she can take her prescription to another pharmacy.  She can be called [phone]

## 2017-04-15 NOTE — Telephone Encounter (Signed)
Letter written. Ok to place TB test. I have no idea how to resolve ADHD medication concerns.

## 2017-04-16 MED ORDER — METHYLPHENIDATE HCL ER (OSM) 27 MG PO TBCR
27 MG | ORAL_TABLET | Freq: Every morning | ORAL | 0 refills | Status: DC
Start: 2017-04-16 — End: 2017-05-31

## 2017-04-16 MED ORDER — METHYLPHENIDATE HCL ER (OSM) 27 MG PO TBCR
27 MG | ORAL_TABLET | Freq: Every morning | ORAL | 0 refills | Status: DC
Start: 2017-04-16 — End: 2017-06-17

## 2017-04-16 MED ORDER — METHYLPHENIDATE HCL ER (OSM) 27 MG PO TBCR
27 MG | ORAL_TABLET | Freq: Every morning | ORAL | 0 refills | Status: DC
Start: 2017-04-16 — End: 2017-06-26

## 2017-04-16 NOTE — Telephone Encounter (Signed)
Please write a new Rx for concerta without CR plain ER should be approved.

## 2017-04-16 NOTE — Telephone Encounter (Signed)
Rx's written. Hope this helps.

## 2017-04-16 NOTE — Addendum Note (Signed)
Addended by: Loistine Chance on: 04/16/2017 12:00 PM     Modules accepted: Orders

## 2017-04-17 ENCOUNTER — Encounter: Payer: PRIVATE HEALTH INSURANCE | Primary: Family Medicine

## 2017-04-17 NOTE — Telephone Encounter (Signed)
Pt was given letter and new Rx today.

## 2017-04-30 NOTE — Telephone Encounter (Signed)
Faxed and informed pt LMOM

## 2017-04-30 NOTE — Telephone Encounter (Signed)
Patient has required LOA from work.  Patient was initially treated for attention deficit/hyperactivity disorder with generic Concerta 27 mg p.o. q.a.m. which had worked well and that her insurance discontinued coverage and required prior authorization led to a break in treatment for the patient who is in school, works, and is a single mother.  Patient has a history of anxiety which the break in therapy led to an aggravation of her anxiety as well as a worsening of focus leading to a stress reaction which led to a need for her to have time to recuperate off of work.  Christine Lynn papers have been completed please fax and notify the patient.

## 2017-05-31 ENCOUNTER — Inpatient Hospital Stay
Admit: 2017-05-31 | Discharge: 2017-05-31 | Disposition: A | Discharge status: Home / Self Care | Payer: PRIVATE HEALTH INSURANCE | Attending: Emergency Medicine

## 2017-05-31 DIAGNOSIS — A64 Unspecified sexually transmitted disease: Secondary | ICD-10-CM

## 2017-05-31 LAB — URINALYSIS
Bilirubin Urine: NEGATIVE
Blood, Urine: NEGATIVE
Glucose, Ur: NEGATIVE mg/dL
Ketones, Urine: NEGATIVE mg/dL
Leukocyte Esterase, Urine: NEGATIVE
Nitrite, Urine: NEGATIVE
Protein, UA: NEGATIVE mg/dL
Specific Gravity, UA: 1.025 (ref 1.005–1.030)
Urobilinogen, Urine: 0.2 E.U./dL (ref <2.0)
pH, UA: 5 (ref 5.0–9.0)

## 2017-05-31 LAB — MICROSCOPIC URINALYSIS

## 2017-05-31 LAB — PREGNANCY, URINE: HCG(Urine) Pregnancy Test: NEGATIVE

## 2017-05-31 LAB — WET PREP, GENITAL
Clue Cells, Wet Prep: NONE SEEN
Trichomonas Prep: NONE SEEN

## 2017-05-31 MED ORDER — AZITHROMYCIN 250 MG PO TABS
250 MG | Freq: Once | ORAL | Status: AC
Start: 2017-05-31 — End: 2017-05-31
  Administered 2017-05-31: 13:00:00 1000 mg via ORAL

## 2017-05-31 MED ORDER — FLUCONAZOLE 150 MG PO TABS
150 MG | ORAL_TABLET | Freq: Once | ORAL | 0 refills | Status: AC
Start: 2017-05-31 — End: 2017-05-31

## 2017-05-31 MED ORDER — CEFTRIAXONE SODIUM 250 MG IJ SOLR
250 MG | Freq: Once | INTRAMUSCULAR | Status: AC
Start: 2017-05-31 — End: 2017-05-31
  Administered 2017-05-31: 13:00:00 250 mg via INTRAMUSCULAR

## 2017-05-31 MED FILL — CEFTRIAXONE SODIUM 250 MG IJ SOLR: 250 MG | INTRAMUSCULAR | Qty: 250

## 2017-05-31 MED FILL — AZITHROMYCIN 250 MG PO TABS: 250 MG | ORAL | Qty: 4

## 2017-05-31 NOTE — ED Provider Notes (Signed)
HPI:  05/31/17,   Time: 8:37 AM         Christine Lynn is a 36 y.o. female presenting to the ED for STD screening. The patient states that 2 weeks ago she slept with a female partner who informed her yesterday that he tested positive for gonorrhea. Patient states that she's not had any symptoms, in particular she has not had any vaginal discharge or dysuria. Patient denies any recent fevers. Patient states she would like pretreated for STDs.    ROS:   Pertinent positives and negatives are stated within HPI, all other systems reviewed and are negative.  --------------------------------------------- PAST HISTORY ---------------------------------------------  Past Medical History:  has a past medical history of Abnormal Pap smear; Anxiety; Depression; Depression; Diabetes mellitus (HCC); Hypertension; Hypothyroidism; Obesity; and Thyroid disease.    Past Surgical History:  has no past surgical history on file.    Social History:  reports that she has never smoked. She has never used smokeless tobacco. She reports that she does not drink alcohol or use drugs.    Family History: family history includes Cancer in her maternal grandfather, maternal grandmother, and another family member; Diabetes in her mother and other family members.     The patient's home medications have been reviewed.    Allergies: Patient has no known allergies.    -------------------------------------------------- RESULTS -------------------------------------------------  All laboratory and radiology results have been personally reviewed by myself   LABS:  Results for orders placed or performed during the hospital encounter of 05/31/17   Wet prep, genital   Result Value Ref Range    Trichomonas Prep None Seen     Yeast, Wet Prep 1+ (A)     Clue Cells, Wet Prep None Seen     Source Wet Prep VAGINAL    Urinalysis   Result Value Ref Range    Color, UA Yellow Straw/Yellow    Clarity, UA CLOUDY (A) Clear    Glucose, Ur Negative Negative mg/dL    Bilirubin  Urine Negative Negative    Ketones, Urine Negative Negative mg/dL    Specific Gravity, UA 1.025 1.005 - 1.030    Blood, Urine Negative Negative    pH, UA 5.0 5.0 - 9.0    Protein, UA Negative Negative mg/dL    Urobilinogen, Urine 0.2 <2.0 E.U./dL    Nitrite, Urine Negative Negative    Leukocyte Esterase, Urine Negative Negative   Pregnancy, urine   Result Value Ref Range    HCG(Urine) Pregnancy Test NEGATIVE NEGATIVE   Microscopic Urinalysis   Result Value Ref Range    WBC, UA 1-3 0 - 5 /HPF    RBC, UA NONE 0 - 2 /HPF    Epi Cells FEW /HPF    Bacteria, UA MODERATE (A) /HPF    Yeast, UA RARE        RADIOLOGY:  Interpreted by Radiologist.  No orders to display       ------------------------- NURSING NOTES AND VITALS REVIEWED ---------------------------   The nursing notes within the ED encounter and vital signs as below have been reviewed.   BP (!) 136/95   Pulse 88   Temp 98.4 F (36.9 C) (Oral)   Resp 16   Wt 280 lb (127 kg)   LMP 05/03/2017   SpO2 100%   BMI 43.85 kg/m   Oxygen Saturation Interpretation: Normal      ---------------------------------------------------PHYSICAL EXAM--------------------------------------      Constitutional/General: Alert and oriented x3, well appearing, non toxic in NAD  Head: NC/AT  Eyes: PERRL, EOMI  Mouth: Oropharynx clear, handling secretions, no trismus  Neck: Supple, full ROM, no meningeal signs  Pulmonary: Lungs clear to auscultation bilaterally, no wheezes, rales, or rhonchi. Not in respiratory distress  Cardiovascular:  Regular rate and rhythm, no murmurs, gallops, or rubs. 2+ distal pulses  Abdomen: Soft, non tender, non distended,   Extremities: Moves all extremities x 4. Warm and well perfused  Skin: warm and dry without rash  Neurologic: GCS 15,  Psych: Normal Affect    Pelvic exam: Performed with female RN present  External exam is normal, absent any lesions  Cervical os is closed, vaginal wall is within normal limits without any lesions or drainage  There is no  suprapubic tenderness on exam      ------------------------------ ED COURSE/MEDICAL DECISION MAKING----------------------  Medications   azithromycin (ZITHROMAX) tablet 1,000 mg (1,000 mg Oral Given 05/31/17 0901)   cefTRIAXone (ROCEPHIN) injection 250 mg (250 mg Intramuscular Given 05/31/17 0901)         Medical Decision Making:    Pelvic exam is within normal limits. Patient was tested for gonorrhea and chlamydia and instructed on safe sex practices. She was pretreated with azithromycin and ceftriaxone in the ED. Patient will follow up with her primary care provider for further management, discussed that she should get HIV testing as well given high risk exposure. Patient is in agreement to the above plan, she was given a prescription for Diflucan as she did have mild yeast on her wet prep but is otherwise asymptomatic. Patient was in agreement to the above plan and she was discharged in stable condition.    Counseling:   The emergency provider has spoken with the patient and discussed today's results, in addition to providing specific details for the plan of care and counseling regarding the diagnosis and prognosis.  Questions are answered at this time and they are agreeable with the plan.      --------------------------------- IMPRESSION AND DISPOSITION ---------------------------------    IMPRESSION  1. STD (female)        DISPOSITION  Disposition: Discharge to home  Patient condition is good                  Burt EkMark Laycee Fitzsimmons, DO  05/31/17 [id number]

## 2017-06-03 NOTE — Care Coordination-Inpatient (Signed)
Ambulatory Care Coordination ED Follow up Call       Reason for ED Visit:  Exposure to STD  Care Management Risk Score: CMRS 8      Left voice message for patient regarding ED follow up call.  Requested patient please return call;  [facility]SS contact information provided.

## 2017-06-03 NOTE — Care Coordination-Inpatient (Signed)
Ambulatory Care Coordination ED Follow up Call       Reason for ED Visit:  Exposure to STD  Care Management Risk Score: CMRS 8      Called patient line busy.

## 2017-06-06 LAB — C. TRACHOMATIS / N. GONORRHOEAE, DNA
C. Trachomatis Amplified: NEGATIVE
N. Gonorrhoeae Amplified: NEGATIVE

## 2017-06-17 ENCOUNTER — Inpatient Hospital Stay
Admit: 2017-06-17 | Discharge: 2017-06-17 | Disposition: A | Discharge status: Home / Self Care | Payer: PRIVATE HEALTH INSURANCE | Attending: Emergency Medicine

## 2017-06-17 DIAGNOSIS — H578 Other specified disorders of eye and adnexa: Secondary | ICD-10-CM

## 2017-06-17 NOTE — Discharge Instructions (Signed)
There was most likely a manufacturing defect in the contact lens you put in this morning. Make sure to discard that contact lens when you get home. You should avoid wearing your contact lenses for the rest of the day to allow your eye's irritation to decrease. When you put your contacts in tomorrow, make sure you use a brand new pair.

## 2017-06-17 NOTE — ED Provider Notes (Signed)
Department of Emergency Medicine   ED  Provider Note  Admit Date/RoomTime: 06/17/2017  8:38 AM  ED Room: 03/03                  HPI:  06/17/17, Time: 8:52 AM  .       Christine Lynn is a 36 y.o. old female presenting to the emergency department with sudden onset foreign body sensation, itching and tearing to left eye, which began 2 hour(s) prior to arrival.  Since onset her symptoms have been improving and mild in severity. Associated signs & symptoms of:  none.    Mechanism:         FB Exposure:   Contact lens.     Chemical Exposure:   No.     Trauma:   No.     High Speed Machinery Use:   No.     Welding:   No.     Circumstances:    Contact Lens Use:   Yes: with onset of symptoms.     Recent URI Sx's:   No.     Spontaneous Onset:   No.     Close Contacts w/Similar Sx's:   No.     Work Related:   No.     History of:         Glaucoma:  No.       Post-operative:  No.       Other Eye Disease:  No.  Tetanus Status: up to date.       Review of Systems:   Pertinent positives and negatives are stated within HPI, all other systems reviewed and are negative.        --------------------------------------------- PAST HISTORY ---------------------------------------------  Past Medical History:  has a past medical history of Abnormal Pap smear; Anxiety; Depression; Depression; Diabetes mellitus (HCC); Hypertension; Hypothyroidism; Obesity; and Thyroid disease.    Past Surgical History:  has no past surgical history on file.    Social History:  reports that she has never smoked. She has never used smokeless tobacco. She reports that she does not drink alcohol or use drugs.    Family History: family history includes Cancer in her maternal grandfather, maternal grandmother, and another family member; Diabetes in her mother and other family members.     The patient's home medications have been reviewed.    Allergies: Patient has no known allergies.        ---------------------------------------------------PHYSICAL  EXAM--------------------------------------    Constitutional/General: Alert and oriented x3, well appearing, non toxic in NAD  Head: Normocephalic and atraumatic  Eyes: PERRL, 4 mm, conjunctiva clear,  no evidence of hyphema, no evidence of entrapment. No pain with EOM, and EOMI. Direct and consensual light reflex normal.    Mouth: Oropharynx clear, handling secretions, no trismus  Neck: Supple, full ROM, non tender to palpation in the midline, no stridor, no crepitus, no meningeal signs  Pulmonary: Lungs clear to auscultation bilaterally. Not in respiratory distress  Cardiovascular:  Regular rate. Regular rhythm  Abdomen: Soft.  Non tender. Non distended.  Musculoskeletal: Moves all extremities x 4. Warm and well perfused  Skin: warm and dry. No rashes.   Neurologic: GCS 15  Psych: Normal Affect    -------------------------------------------------- RESULTS -------------------------------------------------  I have personally reviewed all laboratory and imaging results for this patient. Results are listed below.     LABS:  No results found for this visit on 06/17/17.    RADIOLOGY:  Interpreted by Radiologist.  No orders to display           -------------------------  NURSING NOTES AND VITALS REVIEWED ---------------------------   The nursing notes within the ED encounter and vital signs as below have been reviewed by myself.  BP (!) 138/95   Pulse 82   Temp 97.6 F (36.4 C) (Oral)   Resp 16   Ht 5\' 7"  (1.702 m)   Wt 270 lb (122.5 kg)   LMP 06/03/2017   SpO2 100%   BMI 42.29 kg/m   Oxygen Saturation Interpretation: Normal    The patient's available past medical records and past encounters were reviewed.        ------------------------------ ED COURSE/MEDICAL DECISION MAKING----------------------  Medications - No data to display      Medical Decision Making:    Foreign body sensation while putting in contact lenses in long-time contact lens wearer. Likely manufacturing defect. Counseled patient on discarding  this set of contact lenses and putting in a new set tomorrow, wearing only glasses today. Shared decision making regarding fluorescein staining since her conjunctivae are not injected or otherwise irritated at all on exam and she opted to defer that evaluation for now.    Counseling:   The emergency provider has spoken with the patient and discussed today's results, in addition to providing specific details for the plan of care and counseling regarding the diagnosis and prognosis.  Questions are answered at this time and they are agreeable with the plan.       --------------------------------- IMPRESSION AND DISPOSITION ---------------------------------    IMPRESSION  1. Irritation of left eye    2. Sensation of foreign body in eye        DISPOSITION  Disposition: Discharge to home  Patient condition is good           Elita BooneAlexander S Avis Tirone, DO  Resident  06/17/17 [phone]

## 2017-06-23 ENCOUNTER — Encounter: Attending: Family Medicine | Primary: Family Medicine

## 2017-06-26 ENCOUNTER — Ambulatory Visit
Admit: 2017-06-26 | Discharge: 2017-06-26 | Payer: PRIVATE HEALTH INSURANCE | Attending: Family Medicine | Primary: Family Medicine

## 2017-06-26 DIAGNOSIS — F900 Attention-deficit hyperactivity disorder, predominantly inattentive type: Secondary | ICD-10-CM

## 2017-06-26 MED ORDER — AMLODIPINE BESY-BENAZEPRIL HCL 5-20 MG PO CAPS
5-20 MG | ORAL_CAPSULE | Freq: Every day | ORAL | 3 refills | Status: DC
Start: 2017-06-26 — End: 2017-11-12

## 2017-06-26 MED ORDER — METHYLPHENIDATE HCL ER 36 MG PO TB24
36 MG | ORAL_TABLET | Freq: Every day | ORAL | 0 refills | Status: DC
Start: 2017-06-26 — End: 2017-08-08

## 2017-06-26 MED ORDER — CHLORTHALIDONE 25 MG PO TABS
25 MG | ORAL_TABLET | Freq: Every day | ORAL | 3 refills | Status: DC
Start: 2017-06-26 — End: 2017-11-12

## 2017-06-26 MED ORDER — METHYLPHENIDATE HCL ER (OSM) 36 MG PO TBCR
36 MG | ORAL_TABLET | Freq: Every morning | ORAL | 0 refills | Status: DC
Start: 2017-06-26 — End: 2018-06-17

## 2017-06-26 NOTE — Progress Notes (Signed)
Subjective:     Patient: Christine Lynn is a 36 y.o. female    Agenda: ADHD, HTN      Patient is here to follow up on ADHD. Patient is currently taking: concerta 27 mg po daily. Patient reports medication is  Not wworking as well as initially (now feels like some days not working at all) without side effect of palpitation, insomnia or irritability. Patient is requesting refill, today.  Patient is wondering if increasing the dose would be helpful        Hypertension   This is a chronic problem. The current episode started more than 1 year ago. Progression since onset: worse in the last 3 weeks. Associated symptoms include blurred vision (only with headaches), headaches (Patient believes that headaches are a sign that her blood pressure is been out of control.) and palpitations. Pertinent negatives include no chest pain or shortness of breath. Past treatments include ACE inhibitors, diuretics and calcium channel blockers. The current treatment provides moderate improvement.        Review of Systems   Constitutional: Negative for activity change, chills and fever.   HENT: Negative for congestion, dental problem, rhinorrhea and sore throat.    Eyes: Positive for blurred vision (only with headaches). Negative for pain, redness and visual disturbance.   Respiratory: Negative for chest tightness, shortness of breath and wheezing.    Cardiovascular: Positive for palpitations. Negative for chest pain and leg swelling.   Gastrointestinal: Negative for abdominal pain, constipation, diarrhea, nausea and vomiting.   Endocrine: Negative for polydipsia, polyphagia and polyuria.   Genitourinary: Negative for dysuria.   Musculoskeletal: Negative for arthralgias and back pain.   Skin: Negative for rash.   Allergic/Immunologic: Negative for environmental allergies and food allergies.   Neurological: Positive for headaches (Patient believes that headaches are a sign that her blood  pressure is been out of control.). Negative for dizziness and syncope.   Psychiatric/Behavioral: Negative for decreased concentration, dysphoric mood and sleep disturbance. The patient is not hyperactive.         No Known Allergies  Current Outpatient Prescriptions on File Prior to Visit   Medication Sig Dispense Refill   ??? loratadine (CLARITIN) 10 MG tablet Take 1 PO Daily for allergic symptoms. 30 tablet 1   ??? amLODIPine (NORVASC) 5 MG tablet Take 1 tablet by mouth daily 30 tablet 5   ??? lisinopril-hydrochlorothiazide (PRINZIDE;ZESTORETIC) 20-25 MG per tablet Take 1 tablet by mouth daily 30 tablet 5   ??? topiramate (TOPAMAX) 50 MG tablet Take 1 tablet by mouth daily 30 tablet 3   ??? levothyroxine (SYNTHROID) 125 MCG tablet Take 1 tablet by mouth daily 30 tablet 5   ??? citalopram (CELEXA) 40 MG tablet Take 1.5 tablets by mouth daily 45 tablet 5   ??? Multiple Vitamins-Minerals (THERAPEUTIC MULTIVITAMIN-MINERALS W/ IRON) TABS tablet Take 1 tablet by mouth daily 30 tablet 5     No current facility-administered medications on file prior to visit.       Past Medical History:   Diagnosis Date   ??? Abnormal Pap smear     2011   ??? Anxiety    ??? Depression    ??? Depression 03/28/2014   ??? Diabetes mellitus (HCC)     diet controlled   ??? Hypertension    ??? Hypothyroidism    ??? Obesity    ??? Thyroid disease     hypothyroid      Social History   Substance Use Topics   ??? Smoking  status: Never Smoker   ??? Smokeless tobacco: Never Used   ??? Alcohol use No          Objective:     BP (!) 142/88    Ht 5\' 7"  (1.702 m)    Wt 282 lb (127.9 kg)    LMP 06/03/2017    BMI 44.17 kg/m??     Physical Exam   Constitutional: She is oriented to person, place, and time. She appears well-developed and well-nourished. No distress.   Wt Readings from Last 3 Encounters:  06/26/17 : 282 lb (127.9 kg)  06/17/17 : 270 lb (122.5 kg)  05/31/17 : 280 lb (127 kg)    BP Readings from Last 3 Encounters:  06/26/17 : (!) 142/88  06/17/17 : (!) 138/95  05/31/17 : (!) 136/95        HENT:   Head: Normocephalic and atraumatic.   Right Ear: External ear normal.   Left Ear: External ear normal.   Nose: Nose normal.   Eyes: Conjunctivae and EOM are normal. Pupils are equal, round, and reactive to light. Right eye exhibits no discharge. Left eye exhibits no discharge. No scleral icterus.   Neck: Normal range of motion. Neck supple. No tracheal deviation present. No thyromegaly present.   Cardiovascular: Normal rate, regular rhythm, normal heart sounds and intact distal pulses.    Pulmonary/Chest: Effort normal and breath sounds normal. No respiratory distress. She has no wheezes. She has no rales.   Abdominal: Soft. Bowel sounds are normal. She exhibits no distension and no mass. There is no tenderness. There is no guarding.   Musculoskeletal: Normal range of motion. She exhibits no edema, tenderness or deformity.   Lymphadenopathy:     She has no cervical adenopathy.   Neurological: She is alert and oriented to person, place, and time. Coordination normal.   Skin: Skin is warm and dry. No rash noted. She is not diaphoretic. No erythema. No pallor.   Psychiatric: She has a normal mood and affect. Judgment and thought content normal.       Assessment      1. Attention deficit hyperactivity disorder (ADHD), predominantly inattentive type    2. Essential hypertension         Plan      1. Attention deficit hyperactivity disorder (ADHD), predominantly inattentive type  - methylphenidate (CONCERTA) 36 MG extended release tablet; Take 1 tablet by mouth every morning for 30 days..  Dispense: 30 tablet; Refill: 0  - methylphenidate (CONCERTA) 36 MG CR tablet; Take 1 tablet by mouth daily for 30 days.Raynelle Dick Date: 08/23/17  Dispense: 30 tablet; Refill: 0  - methylphenidate (CONCERTA) 36 MG CR tablet; Take 1 tablet by mouth daily for 30 days.Raynelle Dick Date: 07/23/17  Dispense: 30 tablet; Refill: 0    2. Essential hypertension  - amLODIPine-benazepril (LOTREL) 5-20 MG per capsule; Take 1 capsule  by mouth daily  Dispense: 30 capsule; Refill: 3  - chlorthalidone (HYGROTON) 25 MG tablet; Take 1 tablet by mouth daily  Dispense: 30 tablet; Refill: 3  Reviewed medication benefit/risk/how to take/most common side effects.      Loistine Chance, DO  06/26/17  2:57 PM   Return in about 4 weeks (around 07/24/2017) for Recheck Blood Pressure.           Most recent labs reviewed today No  Dictated using Dragon Naturally Speaking Medical Version 2.4  Proof read however unrecognized voice recognition errors may have occurred

## 2017-07-28 ENCOUNTER — Encounter: Attending: Family Medicine | Primary: Family Medicine

## 2017-08-08 MED ORDER — METHYLPHENIDATE HCL ER 36 MG PO TB24
36 | ORAL_TABLET | Freq: Every day | ORAL | 0 refills | Status: DC
Start: 2017-08-08 — End: 2017-12-16

## 2017-08-08 MED ORDER — METHYLPHENIDATE HCL ER 36 MG PO TB24
36 | ORAL_TABLET | Freq: Every day | ORAL | 0 refills | Status: DC
Start: 2017-08-08 — End: 2017-10-09

## 2017-08-08 NOTE — Progress Notes (Signed)
Subjective:     Patient: Christine Lynn is a 36 y.o. female    Agenda: HTN, ADHD      Patient is here to follow up on ADHD. Patient is currently taking: concerta 36 mg po daily. Patient reports medication is working well without side effect of palpitation, insomnia or irritability. Patient is requesting refill, today.         Hypertension   This is a chronic problem. The current episode started more than 1 year ago. The problem is unchanged. The problem is controlled. Pertinent negatives include no chest pain, headaches, palpitations or shortness of breath. Risk factors for coronary artery disease include obesity. Past treatments include calcium channel blockers, ACE inhibitors and diuretics. The current treatment provides significant improvement. There are no compliance problems.         Review of Systems   Constitutional: Negative for activity change, chills and fever.   HENT: Negative for congestion, dental problem, rhinorrhea and sore throat.    Eyes: Negative for pain, redness and visual disturbance.   Respiratory: Negative for chest tightness, shortness of breath and wheezing.    Cardiovascular: Negative for chest pain, palpitations and leg swelling.   Gastrointestinal: Negative for abdominal pain, constipation, diarrhea, nausea and vomiting.   Endocrine: Negative for polydipsia, polyphagia and polyuria.   Genitourinary: Negative for dysuria.   Musculoskeletal: Negative for arthralgias and back pain.   Skin: Negative for rash.   Allergic/Immunologic: Negative for environmental allergies and food allergies.   Neurological: Negative for dizziness, syncope and headaches.   Psychiatric/Behavioral: Negative for decreased concentration, dysphoric mood and sleep disturbance. The patient is not hyperactive.         No Known Allergies  Current Outpatient Prescriptions on File Prior to Visit   Medication Sig Dispense Refill   . amLODIPine-benazepril (LOTREL) 5-20  MG per capsule Take 1 capsule by mouth daily 30 capsule 3   . chlorthalidone (HYGROTON) 25 MG tablet Take 1 tablet by mouth daily 30 tablet 3   . loratadine (CLARITIN) 10 MG tablet Take 1 PO Daily for allergic symptoms. 30 tablet 1   . amLODIPine (NORVASC) 5 MG tablet Take 1 tablet by mouth daily 30 tablet 5   . lisinopril-hydrochlorothiazide (PRINZIDE;ZESTORETIC) 20-25 MG per tablet Take 1 tablet by mouth daily 30 tablet 5   . topiramate (TOPAMAX) 50 MG tablet Take 1 tablet by mouth daily 30 tablet 3   . levothyroxine (SYNTHROID) 125 MCG tablet Take 1 tablet by mouth daily 30 tablet 5   . citalopram (CELEXA) 40 MG tablet Take 1.5 tablets by mouth daily 45 tablet 5   . Multiple Vitamins-Minerals (THERAPEUTIC MULTIVITAMIN-MINERALS W/ IRON) TABS tablet Take 1 tablet by mouth daily 30 tablet 5   . methylphenidate (CONCERTA) 36 MG extended release tablet Take 1 tablet by mouth every morning for 30 days.. 30 tablet 0     No current facility-administered medications on file prior to visit.       Past Medical History:   Diagnosis Date   . Abnormal Pap smear     2011   . Anxiety    . Depression    . Depression 03/28/2014   . Diabetes mellitus (HCC)     diet controlled   . Hypertension    . Hypothyroidism    . Obesity    . Thyroid disease     hypothyroid      Social History   Substance Use Topics   . Smoking status: Never Smoker   .  Smokeless tobacco: Never Used   . Alcohol use No          Objective:     BP 114/81 (Site: Right Arm, Position: Sitting, Cuff Size: Large Adult)   Pulse 93   Ht 5\' 7"  (1.702 m)   Wt 279 lb 1.6 oz (126.6 kg)   SpO2 99%   BMI 43.71 kg/m     Physical Exam   Constitutional: She is oriented to person, place, and time. She appears well-developed and well-nourished. No distress.   HENT:   Head: Normocephalic and atraumatic.   Right Ear: External ear normal.   Left Ear: External ear normal.   Nose: Nose normal.   Eyes: Conjunctivae and EOM are normal. Pupils are equal, round, and reactive to light.  Right eye exhibits no discharge. Left eye exhibits no discharge. No scleral icterus.   Neck: Normal range of motion. Neck supple. No tracheal deviation present. No thyromegaly present.   Cardiovascular: Normal rate, regular rhythm, normal heart sounds and intact distal pulses.    Pulmonary/Chest: Effort normal and breath sounds normal. No respiratory distress. She has no wheezes. She has no rales.   Abdominal: Soft. Bowel sounds are normal. She exhibits no distension and no mass. There is no tenderness. There is no guarding.   Musculoskeletal: Normal range of motion. She exhibits no edema, tenderness or deformity.   Lymphadenopathy:     She has no cervical adenopathy.   Neurological: She is alert and oriented to person, place, and time. Coordination normal.   Skin: Skin is warm and dry. No rash noted. She is not diaphoretic. No erythema. No pallor.   Psychiatric: She has a normal mood and affect. Judgment and thought content normal.       Assessment      1. Essential hypertension    2. Attention deficit hyperactivity disorder (ADHD), combined type    3. Attention deficit hyperactivity disorder (ADHD), predominantly inattentive type         Plan      1. Essential hypertension    2. Attention deficit hyperactivity disorder (ADHD), combined type    3. Attention deficit hyperactivity disorder (ADHD), predominantly inattentive type  - methylphenidate (CONCERTA) 36 MG CR tablet; Take 1 tablet by mouth daily for 30 days.Raynelle Dick Date: 09/23/17  Dispense: 30 tablet; Refill: 0  - methylphenidate (CONCERTA) 36 MG CR tablet; Take 1 tablet by mouth daily for 30 days.Raynelle Dick Date: 10/23/17  Dispense: 30 tablet; Refill: 0      Loistine Chance, DO  08/08/17  12:19 PM   Return in about 3 months (around 11/08/2017).           Most recent labs reviewed today Yes  Dictated using Dragon Naturally Speaking Medical Version 2.4  Proof read however unrecognized voice recognition errors may have occurred

## 2017-09-18 ENCOUNTER — Telehealth

## 2017-09-18 NOTE — Telephone Encounter (Signed)
Pt would like a call back she has some questions.

## 2017-09-19 MED ORDER — METRONIDAZOLE 500 MG PO TABS
500 MG | ORAL_TABLET | Freq: Once | ORAL | 0 refills | Status: AC
Start: 2017-09-19 — End: 2017-09-19

## 2017-09-19 NOTE — Telephone Encounter (Signed)
lmtco

## 2017-09-19 NOTE — Telephone Encounter (Signed)
Rx sent per order entry.

## 2017-09-19 NOTE — Telephone Encounter (Signed)
Pt rec'd results yesterday over the phone from her gyn office that she tested positive for trichomoniasis. Her ob/gyn is on vacation x2 weeks and the NP there is not able to prescribe medication without seeing pt and deferred her to ED as she can not get an appt for 2 weeks. Pt is requesting medication from pcp if possible. She does not want to let this go for 2 weeks and can not afford to go to ED or urgent care. No openings in our office today.

## 2017-10-09 ENCOUNTER — Inpatient Hospital Stay
Admit: 2017-10-09 | Discharge: 2017-10-09 | Disposition: A | Discharge status: Home / Self Care | Payer: PRIVATE HEALTH INSURANCE | Attending: Emergency Medicine

## 2017-10-09 DIAGNOSIS — N390 Urinary tract infection, site not specified: Secondary | ICD-10-CM

## 2017-10-09 LAB — URINALYSIS
Bilirubin Urine: NEGATIVE
Blood, Urine: NEGATIVE
Glucose, Ur: NEGATIVE mg/dL
Ketones, Urine: NEGATIVE mg/dL
Nitrite, Urine: NEGATIVE
Protein, UA: NEGATIVE mg/dL
Specific Gravity, UA: >=1.03 (ref 1.005–1.030)
Urobilinogen, Urine: 0.2 E.U./dL (ref <2.0)
pH, UA: 6 (ref 5.0–9.0)

## 2017-10-09 LAB — MICROSCOPIC URINALYSIS

## 2017-10-09 LAB — PREGNANCY, URINE: HCG(Urine) Pregnancy Test: NEGATIVE

## 2017-10-09 MED ORDER — PHENAZOPYRIDINE HCL 100 MG PO TABS
100 MG | ORAL_TABLET | Freq: Three times a day (TID) | ORAL | 0 refills | Status: AC | PRN
Start: 2017-10-09 — End: 2017-10-12

## 2017-10-09 MED ORDER — PSEUDOEPH-BROMPHEN-DM 30-2-10 MG/5ML PO SYRP
2-30-10 MG/5ML | Freq: Four times a day (QID) | ORAL | 0 refills | Status: DC | PRN
Start: 2017-10-09 — End: 2018-06-17

## 2017-10-09 MED ORDER — SULFAMETHOXAZOLE-TRIMETHOPRIM 800-160 MG PO TABS
800-160 MG | ORAL_TABLET | Freq: Two times a day (BID) | ORAL | 0 refills | Status: AC
Start: 2017-10-09 — End: 2017-10-19

## 2017-10-09 NOTE — ED Provider Notes (Signed)
The history is provided by the patient.   Illness    The current episode started today. The onset was sudden. The problem is moderate. Associated symptoms include congestion and rhinorrhea. Pertinent negatives include no fever, no diarrhea, no nausea, no vomiting, no ear pain, no headaches, no sore throat, no cough, no wheezing, no rash, no eye discharge, no eye pain and no eye redness.   Dysuria    This is a new problem. The current episode started 12 to 24 hours ago. The problem occurs every urination. The quality of the pain is described as burning. Pertinent negatives include no chills, no nausea, no vomiting and no frequency.       Review of Systems   Constitutional: Negative for chills and fever.   HENT: Positive for congestion and rhinorrhea. Negative for ear pain, sinus pressure and sore throat.    Eyes: Negative for pain, discharge and redness.   Respiratory: Negative for cough, shortness of breath and wheezing.    Cardiovascular: Negative for chest pain.   Gastrointestinal: Negative for abdominal distention, diarrhea, nausea and vomiting.   Genitourinary: Positive for dysuria. Negative for frequency.   Musculoskeletal: Negative for arthralgias and back pain.   Skin: Negative for rash and wound.   Neurological: Negative for weakness and headaches.   Hematological: Negative for adenopathy.   All other systems reviewed and are negative.      Physical Exam   Constitutional: She is oriented to person, place, and time. She appears well-developed and well-nourished.   HENT:   Head: Normocephalic and atraumatic.   Right Ear: Hearing, tympanic membrane and external ear normal.   Left Ear: Hearing, tympanic membrane and external ear normal.   Nose: Mucosal edema present.   Mouth/Throat: Uvula is midline, oropharynx is clear and moist and mucous membranes are normal.   Eyes: Pupils are equal, round, and reactive to light. Conjunctivae, EOM and lids are normal.   Neck: Normal range of motion. Neck supple.    Cardiovascular: Normal rate, regular rhythm and normal heart sounds.    No murmur heard.  Pulmonary/Chest: Effort normal and breath sounds normal.   Abdominal: Soft. Bowel sounds are normal. There is no tenderness. There is no rigidity, no rebound, no guarding and no CVA tenderness.   Musculoskeletal: She exhibits no edema.   Neurological: She is alert and oriented to person, place, and time. She has normal strength. No cranial nerve deficit or sensory deficit. Coordination and gait normal. GCS eye subscore is 4. GCS verbal subscore is 5. GCS motor subscore is 6.   Skin: Skin is warm and dry. No abrasion and no rash noted.   Nursing note and vitals reviewed.      Procedures    MDM          --------------------------------------------- PAST HISTORY ---------------------------------------------  Past Medical History:  has a past medical history of Abnormal Pap smear; Anxiety; Depression; Depression; Diabetes mellitus (HCC); Hypertension; Hypothyroidism; Obesity; and Thyroid disease.    Past Surgical History:  has no past surgical history on file.    Social History:  reports that she has never smoked. She has never used smokeless tobacco. She reports that she does not drink alcohol or use drugs.    Family History: family history includes Cancer in her maternal grandfather, maternal grandmother, and another family member; Diabetes in her mother and other family members.     The patient's home medications have been reviewed.    Allergies: Patient has no known allergies.    --------------------------------------------------  RESULTS -------------------------------------------------  Labs:  Results for orders placed or performed during the hospital encounter of 10/09/17   Urinalysis   Result Value Ref Range    Color, UA Yellow Straw/Yellow    Clarity, UA Clear Clear    Glucose, Ur Negative Negative mg/dL    Bilirubin Urine Negative Negative    Ketones, Urine Negative Negative mg/dL    Specific Gravity, UA >=1.030 1.005 -  1.030    Blood, Urine Negative Negative    pH, UA 6.0 5.0 - 9.0    Protein, UA Negative Negative mg/dL    Urobilinogen, Urine 0.2 <2.0 E.U./dL    Nitrite, Urine Negative Negative    Leukocyte Esterase, Urine SMALL (A) Negative   Pregnancy, urine   Result Value Ref Range    HCG(Urine) Pregnancy Test NEGATIVE NEGATIVE   Microscopic Urinalysis   Result Value Ref Range    WBC, UA 2-5 0 - 5 /HPF    RBC, UA 0-1 0 - 2 /HPF    Epi Cells FEW /HPF    Bacteria, UA FEW (A) /HPF    Yeast, UA MODERATE        Radiology:  No orders to display       ------------------------- NURSING NOTES AND VITALS REVIEWED ---------------------------  Date / Time Roomed:  10/09/2017  6:38 PM  ED Bed Assignment:  03/03    The nursing notes within the ED encounter and vital signs as below have been reviewed.   BP (!) 133/91   Pulse 94   Temp 98.4 F (36.9 C) (Oral)   Resp 20   Wt 286 lb (129.7 kg)   LMP 09/30/2017   SpO2 98%   BMI 44.79 kg/m   Oxygen Saturation Interpretation: Normal      ------------------------------------------ PROGRESS NOTES ------------------------------------------  I have spoken with the patient and discussed today's results, in addition to providing specific details for the plan of care and counseling regarding the diagnosis and prognosis.  Their questions are answered at this time and they are agreeable with the plan. I discussed at length with them reasons for immediate return here for re evaluation. They will followup with primary care by calling their office tomorrow.      --------------------------------- ADDITIONAL PROVIDER NOTES ---------------------------------  At this time the patient is without objective evidence of an acute process requiring hospitalization or inpatient management.  They have remained hemodynamically stable throughout their entire ED visit and are stable for discharge with outpatient follow-up.     The plan has been discussed in detail and they are aware of the specific conditions for  emergent return, as well as the importance of follow-up.      New Prescriptions    BROMPHENIRAMINE-PSEUDOEPHEDRINE-DM 30-2-10 MG/5ML SYRUP    Take 5 mLs by mouth 4 times daily as needed for Congestion or Cough    PHENAZOPYRIDINE (PYRIDIUM) 100 MG TABLET    Take 1 tablet by mouth 3 times daily as needed for Pain    SULFAMETHOXAZOLE-TRIMETHOPRIM (BACTRIM DS) 800-160 MG PER TABLET    Take 1 tablet by mouth 2 times daily for 10 days       Diagnosis:  1. Urinary tract infection without hematuria, site unspecified    2. Acute upper respiratory infection        Disposition:  Patient's disposition: Discharge to home  Patient's condition is stable.                Delma Officer, MD  10/09/17 [phone]

## 2017-10-23 ENCOUNTER — Encounter: Attending: Family Medicine | Primary: Family Medicine

## 2017-11-10 ENCOUNTER — Encounter: Attending: Family Medicine | Primary: Family Medicine

## 2017-11-10 NOTE — Progress Notes (Signed)
Erroneous Encounter

## 2017-11-12 ENCOUNTER — Encounter

## 2017-11-12 NOTE — Telephone Encounter (Signed)
Caller Patient    Date of last appointment:08/08/2017  Date of last refill for this medication:09/23/2017; 06/26/2017; 06/26/2017      Requested Prescriptions     Pending Prescriptions Disp Refills   . methylphenidate (CONCERTA) 36 MG extended release tablet 30 tablet 0     Sig: Take 1 tablet by mouth every morning for 30 days..   . chlorthalidone (HYGROTON) 25 MG tablet 30 tablet 3     Sig: Take 1 tablet by mouth daily   . amLODIPine-benazepril (LOTREL) 5-20 MG per capsule 30 capsule 3     Sig: Take 1 capsule by mouth daily         Amount Requested by patient 90 days       The patient does have a future appointment scheduled with you.03/05/2018    The patients preferred pharmacy has been captured for this encounter?Yes    If patient has not been seen in 6 months, please check to see if they need an appointment

## 2017-11-13 MED ORDER — AMLODIPINE BESY-BENAZEPRIL HCL 5-20 MG PO CAPS
5-20 | ORAL_CAPSULE | Freq: Every day | ORAL | 3 refills | Status: DC
Start: 2017-11-13 — End: 2018-06-17

## 2017-11-13 MED ORDER — CHLORTHALIDONE 25 MG PO TABS
25 | ORAL_TABLET | Freq: Every day | ORAL | 3 refills | Status: DC
Start: 2017-11-13 — End: 2018-06-17

## 2017-11-18 ENCOUNTER — Ambulatory Visit
Admit: 2017-11-18 | Discharge: 2017-11-18 | Payer: PRIVATE HEALTH INSURANCE | Attending: Family | Primary: Family Medicine

## 2017-11-18 DIAGNOSIS — F418 Other specified anxiety disorders: Secondary | ICD-10-CM

## 2017-11-18 MED ORDER — BUSPIRONE HCL 5 MG PO TABS
5 MG | ORAL_TABLET | Freq: Three times a day (TID) | ORAL | 3 refills | Status: AC
Start: 2017-11-18 — End: 2017-12-18

## 2017-11-18 MED ORDER — MEDICAL COMPRESSION SOCKS MISC
Freq: Every day | 0 refills | Status: AC
Start: 2017-11-18 — End: ?

## 2017-11-18 NOTE — Progress Notes (Signed)
Subjective:     Patient: Christine Lynn is a 36 y.o. female     Pt states that she has had increased anxiety for the last 1-2 months. States that it is affecting her sleep, eating, going places. States that a lot of changes have taken place in the last few months. Pt states that she feels that concerta is no longer helping her and states that it was recently increased. Pt has done counseling in the past and states that she feels that it has helped.    Pt reports that she has pain in her feet and legs in the am. Improves through the day. Sx have been for a few months. States that she discussed with PCP in the past. She would like an order for compression socks that they discussed. 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 She does not do any stretching         Review of Systems   Constitutional: Negative for activity change, chills, fatigue and fever.   Respiratory: Negative for cough and shortness of breath.    Cardiovascular: Negative for chest pain and leg swelling.   Gastrointestinal: Negative for nausea and vomiting.   Skin: Negative for rash.   Neurological: Negative for dizziness, light-headedness and headaches.   Psychiatric/Behavioral: Positive for dysphoric mood and sleep disturbance. The patient is nervous/anxious.         Allergies   Allergen Reactions   . Bactrim [Sulfamethoxazole-Trimethoprim] Itching     Burning sensation     Current Outpatient Prescriptions on File Prior to Visit   Medication Sig Dispense Refill   . chlorthalidone (HYGROTON) 25 MG tablet Take 1 tablet by mouth daily 30 tablet 3   . amLODIPine-benazepril (LOTREL) 5-20 MG per capsule Take 1 capsule by mouth daily 30 capsule 3   . methylphenidate (CONCERTA) 36 MG CR tablet Take 1 tablet by mouth daily for 30 days.Raynelle Dick. Earliest Fill Date: 09/23/17 30 tablet 0   . methylphenidate (CONCERTA) 36 MG extended release tablet Take 1 tablet by mouth every morning for 30 days.. 30 tablet 0   . loratadine (CLARITIN) 10 MG tablet Take 1 PO Daily for  allergic symptoms. 30 tablet 1   . levothyroxine (SYNTHROID) 125 MCG tablet Take 1 tablet by mouth daily 30 tablet 5   . citalopram (CELEXA) 40 MG tablet Take 1.5 tablets by mouth daily 45 tablet 5   . Multiple Vitamins-Minerals (THERAPEUTIC MULTIVITAMIN-MINERALS W/ IRON) TABS tablet Take 1 tablet by mouth daily 30 tablet 5   . brompheniramine-pseudoephedrine-DM 30-2-10 MG/5ML syrup Take 5 mLs by mouth 4 times daily as needed for Congestion or Cough 120 mL 0     No current facility-administered medications on file prior to visit.       Past Medical History:   Diagnosis Date   . Abnormal Pap smear     2011   . Anxiety    . Depression    . Depression 03/28/2014   . Diabetes mellitus (HCC)     diet controlled   . Hypertension    . Hypothyroidism    . Obesity    . Thyroid disease     hypothyroid      History reviewed. No pertinent surgical history.  Family History   Problem Relation Age of Onset   . Cancer Maternal Grandmother    . Cancer Maternal Grandfather    . Diabetes Mother    . Diabetes Other  aunts had complications from DM; deceased   . Cancer Other         1 sibling deceased from lung,throat cancer   . Diabetes Other         deceased      Social History   Substance Use Topics   . Smoking status: Never Smoker   . Smokeless tobacco: Never Used   . Alcohol use No          Objective:     BP 129/86 (Site: Left Upper Arm, Position: Sitting, Cuff Size: Medium Adult)   Pulse 81   Temp 98.2 F (36.8 C) (Oral)   Ht 5\' 7"  (1.702 m)   Wt 280 lb 4.8 oz (127.1 kg)   LMP 10/30/2017 (Approximate)   SpO2 99%   BMI 43.90 kg/m     Physical Exam   Constitutional: She is oriented to person, place, and time. No distress.   HENT:   Head: Normocephalic and atraumatic.   Eyes: Conjunctivae are normal. Right eye exhibits no discharge. Left eye exhibits no discharge.   Neck: Neck supple.   Cardiovascular: Normal rate and regular rhythm.    Pulmonary/Chest: Effort normal and breath sounds normal.   Lymphadenopathy:     She  has no cervical adenopathy.   Neurological: She is alert and oriented to person, place, and time.   Skin: Skin is warm and dry. She is not diaphoretic.   Psychiatric: She has a normal mood and affect. Her behavior is normal.   Tearful during depression screening       Assessment      1. Anxiety with depression    2. Pain in both lower extremities         Plan      1. Anxiety with depression  - busPIRone (BUSPAR) 5 MG tablet; Take 1 tablet by mouth 3 times daily  Dispense: 90 tablet; Refill: 3  - [facility] ([address])  -continue celexa 40 mg daily  Recommend follow up with [facility]. Discussed importance of counseling with medication. Her GAD = 21 and PHQ-9 = 24.  Pt agreeable with plan. Denies any SI. States that she is a single parent and has a daughter at home and she would not leave her.     2. Pain in both lower extremities  - Elastic Bandages & Supports (MEDICAL COMPRESSION SOCKS) MISC; 2 each by Does not apply route daily 10-15 mm Hg  Dispense: 6 each; Refill: 0  Discussed that pain may be related to tight muscles/tendons d/t her job. Recommend that she stretches at least nightly before bed. This should help to improve stiffness in the am.    Bonnita NasutiLauren Karesa Maultsby, APRN - CNP  11/18/17  10:18 AM      Health Maintenance Due   Topic Date Due   . DTaP/Tdap/Td vaccine (1 - Tdap) 06/25/2011   . Cervical cancer screen  01/31/2017   . Flu vaccine (1) 08/30/2017       I spent over 51% of total time 25 min counseling and providing discussion regarding   1. Anxiety with depression    2. Pain in both lower extremities     diagnosis, treatment and follow-up as noted above.    Dictated using Dragon Naturally SpeakingMedical Version 2.4  Proof read however unrecognized voice recognition errors may have occurred

## 2017-11-18 NOTE — Patient Instructions (Signed)
Patient Education   Patient Education        Shin Splints (Shin Pain): Exercises  Your Care Instructions  Here are some examples of typical rehabilitation exercises for your condition. Start each exercise slowly. Ease off the exercise if you start to have pain.  Your doctor or physical therapist will tell you when you can start these exercises and which ones will work best for you.  How to do the exercises  Calf wall stretch (back knee straight)    1. Stand facing a wall with your hands on the wall at about eye level. Put your affected leg about a step behind your other leg.  2. Keeping your back leg straight and your back heel on the floor, bend your front knee and gently bring your hip and chest toward the wall until you feel a stretch in the calf of your back leg.  3. Hold the stretch for at least 15 to 30 seconds.  4. Repeat 2 to 4 times.    Calf wall stretch (knees bent)    1. Stand facing a wall with your hands on the wall at about eye level. Put your affected leg about a step behind your other leg.  2. Keeping both heels on the floor, bend both knees. Then gently bring your hip and chest toward the wall until you feel a stretch in the calf of your back leg.  3. Hold the stretch for at least 15 to 30 seconds.  4. Repeat 2 to 4 times.    Hamstring wall stretch    1. Lie on your back in a doorway, with your good leg through the open door.  2. Slide your affected leg up the wall to straighten your knee. You should feel a gentle stretch down the back of your leg.  1. Do not arch your back.  2. Do not bend either knee.  3. Keep one heel touching the floor and the other heel touching the wall. Do not point your toes.  3. Hold the stretch for at least 1 minute to begin. Then over time, try to lengthen the time you hold the stretch to as long as 6 minutes.  4. Repeat 2 to 4 times.  5. If you do not have a place to do this exercise in a doorway, there is another way to do it:  6. Lie on your back, and bend the knee of  your affected leg.  7. Loop a towel under the ball and toes of that foot, and hold the ends of the towel in your hands.  8. Straighten your knee, and slowly pull back on the towel. You should feel a gentle stretch down the back of your leg.  9. Hold the stretch for 15 to 30 seconds. Or even better, hold the stretch for 1 minute if you can.  10. Repeat 2 to 4 times.    Shin muscle stretch    1. Sit in a chair, with both feet flat on the floor.  2. Bend your affected leg behind you so that the top of your foot near your toes is flat on the floor and your toes are pointed away from your body. If you need to, you can hold on to the sides of the chair for support.  3. Hold the stretch for at least 15 to 30 seconds. You should feel a stretch in the front (shin) of your lower leg.  4. Repeat 2 to 4 times.  Follow-up care is a key part of your treatment and safety. Be sure to make and go to all appointments, and call your doctor if you are having problems. It's also a good idea to know your test results and keep a list of the medicines you take.  Where can you learn more?  Go to [email or web address] and sign in to your MyChart account. Enter [phone] in the Search Health Information box to learn more about "Shin Splints (Shin Pain): Exercises."     If you do not have an account, please click on the "Sign Up Now" link.  Current as of: November 27, 2016  Content Version: 11.8   2006-2018 [employer]. Care instructions adapted under license by [facility]. If you have questions about a medical condition or this instruction, always ask your healthcare professional. [employer] disclaims any warranty or liability for your use of this information.            Plantar Fasciitis: Exercises  Your Care Instructions  Here are some examples of typical rehabilitation exercises for your condition. Start each exercise slowly. Ease off the exercise if you start to have pain.  Your doctor or  physical therapist will tell you when you can start these exercises and which ones will work best for you.  How to do the exercises  Towel stretch    1. Sit with your legs extended and knees straight.  2. Place a towel around your foot just under the toes.  3. Hold each end of the towel in each hand, with your hands above your knees.  4. Pull back with the towel so that your foot stretches toward you.  5. Hold the position for at least 15 to 30 seconds.  6. Repeat 2 to 4 times a session, up to 5 sessions a day.    Calf stretch    1. Stand facing a wall with your hands on the wall at about eye level. Put the leg you want to stretch about a step behind your other leg.  2. Keeping your back heel on the floor, bend your front knee until you feel a stretch in the back leg.  3. Hold the stretch for 15 to 30 seconds. Repeat 2 to 4 times.    Plantar fascia and calf stretch    1. Stand on a step as shown above. Be sure to hold on to the banister.  2. Slowly let your heels down over the edge of the step as you relax your calf muscles. You should feel a gentle stretch across the bottom of your foot and up the back of your leg to your knee.  3. Hold the stretch about 15 to 30 seconds, and then tighten your calf muscle a little to bring your heel back up to the level of the step. Repeat 2 to 4 times.    Towel curls    1. While sitting, place your foot on a towel on the floor and scrunch the towel toward you with your toes.  2. Then, also using your toes, push the towel away from you.    Marble pickups    1. Put marbles on the floor next to a cup.  2. Using your toes, try to lift the marbles up from the floor and put them in the cup.    Follow-up care is a key part of your treatment and safety. Be sure to make and go to all appointments, and call your doctor if you are  having problems. It's also a good idea to know your test results and keep a list of the medicines you take.  Where can you learn more?  Go to  [email or web address] and sign in to your MyChart account. Enter [phone] in the Search Health Information box to learn more about "Plantar Fasciitis: Exercises."     If you do not have an account, please click on the "Sign Up Now" link.  Current as of: November 27, 2016  Content Version: 11.8   2006-2018 [employer]. Care instructions adapted under license by [facility]. If you have questions about a medical condition or this instruction, always ask your healthcare professional. [employer] disclaims any warranty or liability for your use of this information.

## 2017-12-16 ENCOUNTER — Ambulatory Visit
Admit: 2017-12-16 | Discharge: 2017-12-16 | Payer: PRIVATE HEALTH INSURANCE | Attending: Family Medicine | Primary: Family Medicine

## 2017-12-16 DIAGNOSIS — F900 Attention-deficit hyperactivity disorder, predominantly inattentive type: Secondary | ICD-10-CM

## 2017-12-16 MED ORDER — BUPROPION HCL ER (XL) 150 MG PO TB24
150 MG | ORAL_TABLET | Freq: Every morning | ORAL | 1 refills | Status: DC
Start: 2017-12-16 — End: 2018-06-17

## 2017-12-16 MED ORDER — METHYLPHENIDATE HCL ER 36 MG PO TB24
36 MG | ORAL_TABLET | Freq: Every day | ORAL | 0 refills | Status: DC
Start: 2017-12-16 — End: 2018-03-16

## 2017-12-16 MED ORDER — METHYLPHENIDATE HCL ER 36 MG PO TB24
36 | ORAL_TABLET | Freq: Every day | ORAL | 0 refills | Status: DC
Start: 2017-12-16 — End: 2018-03-16

## 2017-12-16 NOTE — Progress Notes (Signed)
Subjective:     Patient: Christine LeysLinnise A Myricks is a 36 y.o. female    Agenda: ADHD,           Patient is here to follow up on ADHD. Patient is currently taking: concerta 36 mg po daily. Patient reports medication is working well without side effect of palpitation, insomnia or irritability. Patient is requesting refill, today. Patient is wondering about extending the medication's focus effect without increasing the medication.            Review of Systems   Respiratory: Negative for shortness of breath and wheezing.    Cardiovascular: Negative for chest pain and palpitations.   Gastrointestinal: Negative for abdominal pain, constipation, diarrhea, nausea and vomiting.   Psychiatric/Behavioral:        Affect is congruent with mood        Allergies   Allergen Reactions   . Bactrim [Sulfamethoxazole-Trimethoprim] Itching     Burning sensation     Current Outpatient Prescriptions on File Prior to Visit   Medication Sig Dispense Refill   . busPIRone (BUSPAR) 5 MG tablet Take 1 tablet by mouth 3 times daily 90 tablet 3   . Elastic Bandages & Supports (MEDICAL COMPRESSION SOCKS) MISC 2 each by Does not apply route daily 10-15 mm Hg 6 each 0   . chlorthalidone (HYGROTON) 25 MG tablet Take 1 tablet by mouth daily 30 tablet 3   . amLODIPine-benazepril (LOTREL) 5-20 MG per capsule Take 1 capsule by mouth daily 30 capsule 3   . brompheniramine-pseudoephedrine-DM 30-2-10 MG/5ML syrup Take 5 mLs by mouth 4 times daily as needed for Congestion or Cough 120 mL 0   . methylphenidate (CONCERTA) 36 MG extended release tablet Take 1 tablet by mouth every morning for 30 days.. 30 tablet 0   . loratadine (CLARITIN) 10 MG tablet Take 1 PO Daily for allergic symptoms. 30 tablet 1   . levothyroxine (SYNTHROID) 125 MCG tablet Take 1 tablet by mouth daily 30 tablet 5   . citalopram (CELEXA) 40 MG tablet Take 1.5 tablets by mouth daily 45 tablet 5   . Multiple Vitamins-Minerals (THERAPEUTIC  MULTIVITAMIN-MINERALS W/ IRON) TABS tablet Take 1 tablet by mouth daily 30 tablet 5     No current facility-administered medications on file prior to visit.       Past Medical History:   Diagnosis Date   . Abnormal Pap smear     2011   . Anxiety    . Depression    . Depression 03/28/2014   . Diabetes mellitus (HCC)     diet controlled   . Hypertension    . Hypothyroidism    . Obesity    . Thyroid disease     hypothyroid      Social History   Substance Use Topics   . Smoking status: Never Smoker   . Smokeless tobacco: Never Used   . Alcohol use No          Objective:     BP 128/84 (Site: Right Upper Arm, Position: Sitting, Cuff Size: Large Adult)   Pulse 91   Ht 5\' 7"  (1.702 m)   Wt 272 lb 6.4 oz (123.6 kg)   LMP 10/30/2017 (Approximate)   SpO2 96%   BMI 42.66 kg/m     Physical Exam   Constitutional: She is oriented to person, place, and time. She appears well-developed and well-nourished.   HENT:   Head: Normocephalic and atraumatic.   Eyes: Pupils are equal, round, and  reactive to light. EOM are normal.   Cardiovascular: Normal rate, regular rhythm, normal heart sounds and intact distal pulses.  Exam reveals no gallop and no friction rub.    No murmur heard.  Pulmonary/Chest: Effort normal and breath sounds normal. No respiratory distress. She has no wheezes. She has no rales.   Abdominal: Soft. Bowel sounds are normal. She exhibits no distension and no mass. There is no tenderness. There is no rebound and no guarding.   Neurological: She is alert and oriented to person, place, and time.   Psychiatric: She has a normal mood and affect. Judgment normal.       Assessment      1. Attention deficit hyperactivity disorder (ADHD), predominantly inattentive type         Plan      1. Attention deficit hyperactivity disorder (ADHD), predominantly inattentive type  - methylphenidate (CONCERTA) 36 MG CR tablet; Take 1 tablet by mouth daily for 30 days.Raynelle Dick. Earliest Fill Date: 01/13/18  Dispense: 30 tablet; Refill: 0  -  methylphenidate (CONCERTA) 36 MG CR tablet; Take 1 tablet by mouth daily for 30 days.Raynelle Dick. Earliest Fill Date: 02/13/18  Dispense: 30 tablet; Refill: 0  - methylphenidate (CONCERTA) 36 MG CR tablet; Take 1 tablet by mouth daily for 30 days..  Dispense: 30 tablet; Refill: 0  - buPROPion (WELLBUTRIN XL) 150 MG extended release tablet; Take 1 tablet by mouth every morning  Dispense: 90 tablet; Refill: 1      Loistine ChanceMorton Jonovan Boedecker, DO  12/16/17  2:24 PM   Return in about 3 months (around 03/16/2018) for Recheck ADHD.       topomax stopped by patient 2 months ago    Mostrecent labs reviewed today Yes  I spent over 51% total time 25 minutes counseling (or coordinating care) and provided discussion regarding ADHD in the management of attention deficit/hyperactivity disorder medications and their effects and side effects.  Also discussed the use of bupropion with methylphenidate and discussed risks and benefit also reviewed the use of bupropion with buspirone as well as citalopram without significant interactions with either of those medications.  And we will follow-up in 3 months for recheck.  Patient verbalized understanding, agreed with plan and thanked me for my time. Will follow.      Dictated using Dragon Naturally Speaking MedicalVersion 2.4  Proof read however unrecognized voice recognition errors may have occurred

## 2018-03-05 ENCOUNTER — Encounter: Attending: Family Medicine | Primary: Family Medicine

## 2018-03-16 ENCOUNTER — Ambulatory Visit
Admit: 2018-03-16 | Discharge: 2018-03-16 | Payer: PRIVATE HEALTH INSURANCE | Attending: Family Medicine | Primary: Family Medicine

## 2018-03-16 DIAGNOSIS — F900 Attention-deficit hyperactivity disorder, predominantly inattentive type: Secondary | ICD-10-CM

## 2018-03-16 MED ORDER — AMPHETAMINE-DEXTROAMPHET ER 20 MG PO CP24
20 | ORAL_CAPSULE | Freq: Every morning | ORAL | 0 refills | Status: DC
Start: 2018-03-16 — End: 2018-06-16

## 2018-03-16 MED ORDER — CITALOPRAM HYDROBROMIDE 40 MG PO TABS
40 MG | ORAL_TABLET | Freq: Every day | ORAL | 3 refills | Status: DC
Start: 2018-03-16 — End: 2018-06-17

## 2018-03-16 NOTE — Progress Notes (Signed)
Subjective:     Patient: Christine Lynn is a 37 y.o. female    Agenda: adhd, Major depression,       Patient is here to follow up on ADHD. Patient is currently taking: concerta 36 mg po daily. Patient reports medication is not working.  In addition the patient is taking bupropion as an adjunct for attention deficit/hyperactivity disorder but also would be available to improve the negative symptoms of depression which are component of the patient's concerns today.        Depression: not improved on citalopram 40 and buspirone 5 mg tid for anxiety - not noticing buspirone being effective.  Due to the fact that the patient is having evening ruminations that seem to be breaking through her selective serotonin reuptake inhibitor she and I agreed to it a trial of increased dose of citalopram to 60 mg p.o. q. Day      Thyroidism: Patient is medically stable and needs the recheck of thyroid-stimulating hormone and so we'll follow-up on that.    Maintenance: Patient is medically stable and needs rechecked for hemoglobin A1c for risk for diabetes and also needs an update on potassium levels and creatinine for hypertension       Review of Systems   Constitutional: Negative for activity change, chills and fever.   HENT: Negative for congestion, dental problem, rhinorrhea and sore throat.    Eyes: Negative for pain, redness and visual disturbance.   Respiratory: Negative for chest tightness, shortness of breath and wheezing.    Cardiovascular: Negative for chest pain, palpitations and leg swelling.   Gastrointestinal: Negative for abdominal pain, constipation, diarrhea, nausea and vomiting.   Endocrine: Negative for polydipsia, polyphagia and polyuria.   Genitourinary: Negative for dysuria.   Musculoskeletal: Negative for arthralgias and back pain.   Skin: Negative for rash.   Allergic/Immunologic: Negative for environmental allergies and food allergies.   Neurological:  Negative for dizziness, syncope and headaches.   Psychiatric/Behavioral: Negative for decreased concentration, dysphoric mood and sleep disturbance. The patient is not hyperactive.         Allergies   Allergen Reactions   ??? Bactrim [Sulfamethoxazole-Trimethoprim] Itching     Burning sensation     Current Outpatient Medications on File Prior to Visit   Medication Sig Dispense Refill   ??? buPROPion (WELLBUTRIN XL) 150 MG extended release tablet Take 1 tablet by mouth every morning 90 tablet 1   ??? Elastic Bandages & Supports (MEDICAL COMPRESSION SOCKS) MISC 2 each by Does not apply route daily 10-15 mm Hg 6 each 0   ??? chlorthalidone (HYGROTON) 25 MG tablet Take 1 tablet by mouth daily 30 tablet 3   ??? amLODIPine-benazepril (LOTREL) 5-20 MG per capsule Take 1 capsule by mouth daily 30 capsule 3   ??? methylphenidate (CONCERTA) 36 MG extended release tablet Take 1 tablet by mouth every morning for 30 days.. 30 tablet 0   ??? levothyroxine (SYNTHROID) 125 MCG tablet Take 1 tablet by mouth daily 30 tablet 5   ??? citalopram (CELEXA) 40 MG tablet Take 1.5 tablets by mouth daily 45 tablet 5   ??? brompheniramine-pseudoephedrine-DM 30-2-10 MG/5ML syrup Take 5 mLs by mouth 4 times daily as needed for Congestion or Cough 120 mL 0   ??? loratadine (CLARITIN) 10 MG tablet Take 1 PO Daily for allergic symptoms. 30 tablet 1   ??? Multiple Vitamins-Minerals (THERAPEUTIC MULTIVITAMIN-MINERALS W/ IRON) TABS tablet Take 1 tablet by mouth daily 30 tablet 5     No current  facility-administered medications on file prior to visit.       Past Medical History:   Diagnosis Date   ??? Abnormal Pap smear     2011   ??? Anxiety    ??? Depression    ??? Depression 03/28/2014   ??? Diabetes mellitus (HCC)     diet controlled   ??? Hypertension    ??? Hypothyroidism    ??? Obesity    ??? Thyroid disease     hypothyroid      Social History     Tobacco Use   ??? Smoking status: Never Smoker   ??? Smokeless tobacco: Never Used   Substance Use Topics   ??? Alcohol use: No           Objective:     BP 117/79 (Site: Right Upper Arm, Position: Sitting, Cuff Size: Large Adult)    Pulse 101    Ht 5\' 7"  (1.702 m)    Wt 279 lb (126.6 kg)    LMP 02/23/2018    BMI 43.70 kg/m??     Physical Exam   Constitutional: She is oriented to person, place, and time. She appears well-developed and well-nourished. No distress.   Wt Readings from Last 3 Encounters:  03/16/18 : 279 lb (126.6 kg)  12/16/17 : 272 lb 6.4 oz (123.6 kg)  11/18/17 : 280 lb 4.8 oz (127.1 kg)     HENT:   Head: Normocephalic and atraumatic.   Right Ear: External ear normal.   Left Ear: External ear normal.   Nose: Nose normal.   Eyes: Pupils are equal, round, and reactive to light. Conjunctivae and EOM are normal. Right eye exhibits no discharge. Left eye exhibits no discharge. No scleral icterus.   Neck: Normal range of motion. Neck supple. No tracheal deviation present. No thyromegaly present.   Cardiovascular: Normal rate, regular rhythm, normal heart sounds and intact distal pulses.   Pulmonary/Chest: Effort normal and breath sounds normal. No respiratory distress. She has no wheezes. She has no rales.   Abdominal: Soft. Bowel sounds are normal. She exhibits no distension and no mass. There is no tenderness. There is no guarding.   Musculoskeletal: Normal range of motion. She exhibits no edema, tenderness or deformity.   Lymphadenopathy:     She has no cervical adenopathy.   Neurological: She is alert and oriented to person, place, and time. Coordination normal.   Skin: Skin is warm and dry. No rash noted. She is not diaphoretic. No erythema. No pallor.   Psychiatric: She has a normal mood and affect. Judgment and thought content normal.       Assessment      1. Attention deficit hyperactivity disorder (ADHD), predominantly inattentive type    2. Moderate episode of recurrent major depressive disorder (HCC)    3. Screening for diabetes mellitus    4. Hypothyroidism (acquired)    5. Essential hypertension         Plan       1.  Attention deficit hyperactivity disorder (ADHD), predominantly inattentive type  - amphetamine-dextroamphetamine (ADDERALL XR) 20 MG extended release capsule; Take 1 capsule by mouth every morning for 30 days.  Dispense: 30 capsule; Refill: 0  - amphetamine-dextroamphetamine (ADDERALL XR) 20 MG extended release capsule; Take 1 capsule by mouth every morning for 30 days.  Dispense: 30 capsule; Refill: 0  - amphetamine-dextroamphetamine (ADDERALL XR) 20 MG extended release capsule; Take 1 capsule by mouth every morning for 30 days.  Dispense: 30 capsule; Refill: 0  2. Moderate episode of recurrent major depressive disorder (HCC)  - citalopram (CELEXA) 40 MG tablet; Take 1.5 tablets by mouth daily  Dispense: 45 tablet; Refill: 3    3. Screening for diabetes mellitus  - Hemoglobin A1C; Future    4. Hypothyroidism (acquired)  - TSH without Reflex; Future    5. Essential hypertension  - Comprehensive Metabolic Panel; Future      Loistine Chance, DO  03/16/18  6:19 PM   No follow-ups on file.         SUMMA SIX    Diabetes a1c <9: not applicable  DM microalbumin/cr ratio within 1 year: not applicable  Hypertension controlled: BP goal<140/<90 mgHg    yes  Pneumonia vaccine Up to date: not applicable  Colon cancer screening up to date: not applicable  Breast cancer screening up to date: not applicable\\  Mostrecent labs reviewed today Yes  I spent over 51% total time 40 minutes counseling (or coordinating care) and provided discussion regarding attention deficit/hyperactivity disorder management of attention deficit/hyperactivity disorder medication changes effects and side effects major depression with features of anxiety discontinuation of buspirone maintaining current dose of bupropion and augmentation of citalopram from 40 mg to 60 mg due to focus of problem mostly being ruminations and inability to unwind at night patient has a very complex and busy schedule in school 2 jobs one job is p[employer] but also a single mother  trying to raise it and 36-year-old little girl.  This could obviously be her primary source of fatigue however we will go ahead and recheck thyroid check for diabetes the patient is also concerned about inability to lose weight although with all that stress I think again this would be unlikely despite the fact that she is taking a stimulant for attention deficit/hyperactivity disorder.  Also discussed hypertension management of hypertension medication effects and side effects and we are planning to follow up in 3 months or at the patient's request.  Patient verbalized understanding, agreed with plan and thanked me for my time. Will follow.    '  Dictated using Dragon Naturally Speaking MedicalVersion 2.4  Proof read however unrecognized voice recognition errors may have occurred

## 2018-03-17 ENCOUNTER — Inpatient Hospital Stay: Payer: PRIVATE HEALTH INSURANCE | Primary: Family Medicine

## 2018-03-17 ENCOUNTER — Encounter: Attending: Family Medicine | Primary: Family Medicine

## 2018-03-17 DIAGNOSIS — Z131 Encounter for screening for diabetes mellitus: Secondary | ICD-10-CM

## 2018-03-17 LAB — COMPREHENSIVE METABOLIC PANEL
ALT: 17 U/L (ref 0–32)
AST: 14 U/L (ref 0–31)
Albumin: 4.1 g/dL (ref 3.5–5.2)
Alkaline Phosphatase: 57 U/L (ref 35–104)
Anion Gap: 11 mmol/L (ref 7–16)
BUN: 11 mg/dL (ref 6–20)
CO2: 29 mmol/L (ref 22–29)
Calcium: 9.8 mg/dL (ref 8.6–10.2)
Chloride: 102 mmol/L (ref 98–107)
Creatinine: 0.8 mg/dL (ref 0.5–1.0)
GFR African American: >60
GFR Non-African American: >60 mL/min/{1.73_m2} (ref >=60)
Glucose: 98 mg/dL (ref 74–99)
Potassium: 4 mmol/L (ref 3.5–5.0)
Sodium: 142 mmol/L (ref 132–146)
Total Bilirubin: 0.3 mg/dL (ref 0.0–1.2)
Total Protein: 7.8 g/dL (ref 6.4–8.3)

## 2018-03-17 LAB — TSH: TSH: 1.52 u[IU]/mL (ref 0.270–4.200)

## 2018-03-17 LAB — HEMOGLOBIN A1C: Hemoglobin A1C: 5.7 % — ABNORMAL HIGH (ref 4.0–5.6)

## 2018-03-30 NOTE — Telephone Encounter (Signed)
From: Earnestine LeysLinnise A Malacara  To: Loistine ChanceMorton Saunders, DO  Sent: 03/30/2018 3:52 PM EDT  Subject: Test Results Question    Can I have a updated immunization record sent to me by email?   [email or web address]

## 2018-03-31 NOTE — Telephone Encounter (Signed)
Mailed

## 2018-04-24 ENCOUNTER — Inpatient Hospital Stay: Admit: 2018-04-24 | Payer: PRIVATE HEALTH INSURANCE | Primary: Family Medicine

## 2018-04-24 ENCOUNTER — Encounter

## 2018-04-24 DIAGNOSIS — E042 Nontoxic multinodular goiter: Secondary | ICD-10-CM

## 2018-04-30 NOTE — Telephone Encounter (Signed)
From: Earnestine Leys  To: Loistine Chance, DO  Sent: 04/30/2018 8:55 AM EDT  Subject: Non-Urgent Medical Question    I have been having issues with swallowing and I had a ultrasound done Friday April 26. I have yet to see any results or hear from the ENT. I was wondering if you could let me know the results

## 2018-06-17 ENCOUNTER — Ambulatory Visit
Admit: 2018-06-17 | Discharge: 2018-06-17 | Payer: PRIVATE HEALTH INSURANCE | Attending: Family Medicine | Primary: Family Medicine

## 2018-06-17 DIAGNOSIS — E039 Hypothyroidism, unspecified: Secondary | ICD-10-CM

## 2018-06-17 MED ORDER — BUPROPION HCL ER (XL) 150 MG PO TB24
150 MG | ORAL_TABLET | Freq: Every morning | ORAL | 1 refills | Status: DC
Start: 2018-06-17 — End: 2019-02-01

## 2018-06-17 MED ORDER — CHLORTHALIDONE 25 MG PO TABS
25 MG | ORAL_TABLET | Freq: Every day | ORAL | 3 refills | Status: DC
Start: 2018-06-17 — End: 2018-11-19

## 2018-06-17 MED ORDER — AMLODIPINE BESY-BENAZEPRIL HCL 5-20 MG PO CAPS
5-20 MG | ORAL_CAPSULE | Freq: Every day | ORAL | 3 refills | Status: DC
Start: 2018-06-17 — End: 2018-11-19

## 2018-06-17 MED ORDER — LEVOTHYROXINE SODIUM 125 MCG PO TABS
125 MCG | ORAL_TABLET | Freq: Every day | ORAL | 5 refills | Status: DC
Start: 2018-06-17 — End: 2019-02-01

## 2018-06-17 MED ORDER — CITALOPRAM HYDROBROMIDE 40 MG PO TABS
40 MG | ORAL_TABLET | Freq: Every day | ORAL | 3 refills | Status: DC
Start: 2018-06-17 — End: 2019-02-01

## 2018-06-17 MED ORDER — AMPHETAMINE-DEXTROAMPHET ER 20 MG PO CP24
20 MG | ORAL_CAPSULE | Freq: Every morning | ORAL | 0 refills | Status: DC
Start: 2018-06-17 — End: 2018-09-05

## 2018-06-17 NOTE — Progress Notes (Signed)
Subjective:     Patient: Christine Lynn is a 37 y.o. female    Agenda: ADHD f/u, HTN      Patient is here to follow up on ADHD. Patient is currently taking: adderall XR gen 20 mg po daily. Patient reports medication is working well without side effect of palpitation, insomnia or irritability. Patient is requesting refill, today.       Mood: patient began celexa in a high stress situation and it has been effective. Patient is stable on current dose of citalopram      Hypertension   This is a chronic problem. The current episode started more than 1 year ago. The problem is unchanged. The problem is controlled. Pertinent negatives include no chest pain, headaches, palpitations or shortness of breath. Past treatments include calcium channel blockers, ACE inhibitors and diuretics. The current treatment provides significant improvement. There are no compliance problems.         Review of Systems   Constitutional: Negative for activity change, chills and fever.   HENT: Negative for congestion, dental problem, rhinorrhea and sore throat.    Eyes: Negative for pain, redness and visual disturbance.   Respiratory: Negative for chest tightness, shortness of breath and wheezing.    Cardiovascular: Negative for chest pain, palpitations and leg swelling.   Gastrointestinal: Negative for abdominal pain, constipation, diarrhea, nausea and vomiting.   Endocrine: Negative for polydipsia, polyphagia and polyuria.   Genitourinary: Negative for dysuria.   Musculoskeletal: Negative for arthralgias and back pain.   Skin: Negative for rash.   Allergic/Immunologic: Negative for environmental allergies and food allergies.   Neurological: Negative for dizziness, syncope and headaches.   Psychiatric/Behavioral: Negative for decreased concentration, dysphoric mood and sleep disturbance. The patient is not hyperactive.         Allergies   Allergen Reactions   ??? Bactrim  [Sulfamethoxazole-Trimethoprim] Itching     Burning sensation     Current Outpatient Medications on File Prior to Visit   Medication Sig Dispense Refill   ??? Elastic Bandages & Supports (MEDICAL COMPRESSION SOCKS) MISC 2 each by Does not apply route daily 10-15 mm Hg 6 each 0   ??? loratadine (CLARITIN) 10 MG tablet Take 1 PO Daily for allergic symptoms. 30 tablet 1   ??? Multiple Vitamins-Minerals (THERAPEUTIC MULTIVITAMIN-MINERALS W/ IRON) TABS tablet Take 1 tablet by mouth daily 30 tablet 5     No current facility-administered medications on file prior to visit.       Past Medical History:   Diagnosis Date   ??? Abnormal Pap smear     2011   ??? Anxiety    ??? Depression    ??? Depression 03/28/2014   ??? Diabetes mellitus (HCC)     diet controlled   ??? Hypertension    ??? Hypothyroidism    ??? Obesity    ??? Thyroid disease     hypothyroid      Social History     Tobacco Use   ??? Smoking status: Never Smoker   ??? Smokeless tobacco: Never Used   Substance Use Topics   ??? Alcohol use: No          Objective:     BP 122/87    Pulse 79    Resp 12    Ht 5\' 7"  (1.702 m)    Wt 284 lb (128.8 kg)    LMP 06/03/2018    Breastfeeding? No    BMI 44.48 kg/m??     Physical Exam  Constitutional: She is oriented to person, place, and time. She appears well-developed and well-nourished. No distress.   Wt Readings from Last 3 Encounters:  06/17/18 : 284 lb (128.8 kg)  03/16/18 : 279 lb (126.6 kg)  12/16/17 : 272 lb 6.4 oz (123.6 kg)     HENT:   Head: Normocephalic and atraumatic.   Right Ear: External ear normal.   Left Ear: External ear normal.   Nose: Nose normal.   Eyes: Pupils are equal, round, and reactive to light. Conjunctivae and EOM are normal. Right eye exhibits no discharge. Left eye exhibits no discharge. No scleral icterus.   Neck: Normal range of motion. Neck supple. No tracheal deviation present. No thyromegaly present.   Cardiovascular: Normal rate, regular rhythm, normal heart sounds and intact distal pulses.   Pulmonary/Chest: Effort  normal and breath sounds normal. No respiratory distress. She has no wheezes. She has no rales.   Abdominal: Soft. Bowel sounds are normal. She exhibits no distension and no mass. There is no tenderness. There is no guarding.   Musculoskeletal: Normal range of motion. She exhibits no edema, tenderness or deformity.   Lymphadenopathy:     She has no cervical adenopathy.   Neurological: She is alert and oriented to person, place, and time. Coordination normal.   Skin: Skin is warm and dry. No rash noted. She is not diaphoretic. No erythema. No pallor.   Psychiatric: She has a normal mood and affect. Judgment and thought content normal.       Assessment      1. Hypothyroidism (acquired)    2. Attention deficit hyperactivity disorder (ADHD), predominantly inattentive type    3. Essential hypertension    4. Moderate episode of recurrent major depressive disorder (HCC)    5. Morbid obesity with BMI of 40.0-44.9, adult (HCC)         Plan       1. Attention deficit hyperactivity disorder (ADHD), predominantly inattentive type  - amphetamine-dextroamphetamine (ADDERALL XR) 20 MG extended release capsule; Take 1 capsule by mouth every morning for 30 days.  Dispense: 30 capsule; Refill: 0  - amphetamine-dextroamphetamine (ADDERALL XR) 20 MG extended release capsule; Take 1 capsule by mouth every morning for 30 days.  Dispense: 30 capsule; Refill: 0  - amphetamine-dextroamphetamine (ADDERALL XR) 20 MG extended release capsule; Take 1 capsule by mouth every morning for 30 days.  Dispense: 30 capsule; Refill: 0  - buPROPion (WELLBUTRIN XL) 150 MG extended release tablet; Take 1 tablet by mouth every morning  Dispense: 90 tablet; Refill: 1    2. Essential hypertension  - amLODIPine-benazepril (LOTREL) 5-20 MG per capsule; Take 1 capsule by mouth daily  Dispense: 30 capsule; Refill: 3  - chlorthalidone (HYGROTON) 25 MG tablet; Take 1 tablet by mouth daily  Dispense: 30 tablet; Refill: 3    3. Moderate episode of recurrent major  depressive disorder (HCC)  - citalopram (CELEXA) 40 MG tablet; Take 1.5 tablets by mouth daily  Dispense: 45 tablet; Refill: 3    4. Hypothyroidism (acquired)  - levothyroxine (SYNTHROID) 125 MCG tablet; Take 1 tablet by mouth daily  Dispense: 30 tablet; Refill: 5    5. Morbid obesity with BMI of 40.0-44.9, adult (HCC)  discussed healthy TLC weight loss through calorie restriction. Exercise as a healthy component of wellness.     Loistine ChanceMorton Navjot Pilgrim, DO  06/16/18  8:54 AM   Return in about 3 months (around 09/17/2018).       Mostrecent labs reviewed today Yes  Dictated using Dragon Naturally Speaking MedicalVersion 2.4  Proof read however unrecognized voice recognition errors may have occurred

## 2018-07-11 ENCOUNTER — Inpatient Hospital Stay
Admit: 2018-07-11 | Discharge: 2018-07-11 | Disposition: A | Discharge status: Home / Self Care | Payer: PRIVATE HEALTH INSURANCE | Attending: Emergency Medicine

## 2018-07-11 DIAGNOSIS — Z202 Contact with and (suspected) exposure to infections with a predominantly sexual mode of transmission: Secondary | ICD-10-CM

## 2018-07-11 LAB — PREGNANCY, URINE: HCG(Urine) Pregnancy Test: NEGATIVE

## 2018-07-11 LAB — URINALYSIS
Bilirubin Urine: NEGATIVE
Blood, Urine: NEGATIVE
Glucose, Ur: NEGATIVE mg/dL
Ketones, Urine: NEGATIVE mg/dL
Nitrite, Urine: NEGATIVE
Protein, UA: NEGATIVE mg/dL
Specific Gravity, UA: 1.025 (ref 1.005–1.030)
Urobilinogen, Urine: 1 E.U./dL (ref <2.0)
pH, UA: 5.5 (ref 5.0–9.0)

## 2018-07-11 LAB — MICROSCOPIC URINALYSIS

## 2018-07-11 MED ORDER — ONDANSETRON HCL 4 MG/2ML IJ SOLN
4 MG/2ML | Freq: Once | INTRAMUSCULAR | Status: DC
Start: 2018-07-11 — End: 2018-07-11

## 2018-07-11 MED ORDER — AZITHROMYCIN 250 MG PO TABS
250 MG | Freq: Once | ORAL | Status: AC
Start: 2018-07-11 — End: 2018-07-11
  Administered 2018-07-11: 13:00:00 1000 mg via ORAL

## 2018-07-11 MED ORDER — ONDANSETRON 4 MG PO TBDP
4 MG | Freq: Once | ORAL | Status: AC
Start: 2018-07-11 — End: 2018-07-11
  Administered 2018-07-11: 13:00:00 8 mg via ORAL

## 2018-07-11 MED ORDER — CEFTRIAXONE SODIUM 250 MG IJ SOLR
250 MG | Freq: Once | INTRAMUSCULAR | Status: AC
Start: 2018-07-11 — End: 2018-07-11
  Administered 2018-07-11: 13:00:00 250 mg via INTRAMUSCULAR

## 2018-07-11 MED ORDER — ONDANSETRON HCL 4 MG PO TABS
4 MG | ORAL_TABLET | Freq: Two times a day (BID) | ORAL | 0 refills | Status: AC | PRN
Start: 2018-07-11 — End: ?

## 2018-07-11 MED FILL — CEFTRIAXONE SODIUM 250 MG IJ SOLR: 250 mg | INTRAMUSCULAR | Qty: 250

## 2018-07-11 MED FILL — ONDANSETRON 4 MG PO TBDP: 4 mg | ORAL | Qty: 2

## 2018-07-11 MED FILL — LIDOCAINE HCL 1 % IJ SOLN: 1 % | INTRAMUSCULAR | Qty: 10

## 2018-07-11 MED FILL — AZITHROMYCIN 250 MG PO TABS: 250 mg | ORAL | Qty: 4

## 2018-07-11 NOTE — ED Provider Notes (Signed)
HPI:  07/11/18, Time: 8:58 AM         Christine Lynn is a 37 y.o. female presenting to the ED for STD exposure, beginning 1 week ago.  The complaint has been persistent, mild in severity, and worsened by nothing.  She states she was informed that by her partner that he was recently treated for an STD of unknown type. She has noticed a foul vaginal smell for the last several days. Denies any other constitutional symptoms. She would like full Abx tx today.     Review of Systems:   Pertinent positives and negatives are stated within HPI, all other systems reviewed and are negative.          --------------------------------------------- PAST HISTORY ---------------------------------------------  Past Medical History:  has a past medical history of Abnormal Pap smear, Anxiety, Depression, Depression, Diabetes mellitus (HCC), Hypertension, Hypothyroidism, Obesity, and Thyroid disease.    Past Surgical History:  has no past surgical history on file.    Social History:  reports that she has never smoked. She has never used smokeless tobacco. She reports that she does not drink alcohol or use drugs.    Family History: family history includes Cancer in her maternal grandfather, maternal grandmother, and another family member; Diabetes in her mother and other family members.     The patient???s home medications have been reviewed.    Allergies: Bactrim [sulfamethoxazole-trimethoprim]    -------------------------------------------------- RESULTS -------------------------------------------------  All laboratory and radiology results have been personally reviewed by myself   LABS:  Results for orders placed or performed during the hospital encounter of 07/11/18   C.trachomatis N.gonorrhoeae DNA, Urine   Result Value Ref Range    Source Urine    Pregnancy, urine   Result Value Ref Range    HCG(Urine) Pregnancy Test NEGATIVE NEGATIVE       RADIOLOGY:  Interpreted by Radiologist.  No orders to display        ------------------------- NURSING NOTES AND VITALS REVIEWED ---------------------------   The nursing notes within the ED encounter and vital signs as below have been reviewed.   BP 120/85    Pulse 83    Temp 98.5 ??F (36.9 ??C) (Oral)    Resp 16    Ht 5\' 7"  (1.702 m)    Wt 280 lb (127 kg)    LMP 07/01/2018    SpO2 99%    BMI 43.85 kg/m??   Oxygen Saturation Interpretation: Normal      ---------------------------------------------------PHYSICAL EXAM--------------------------------------      Constitutional/General: Alert and oriented x3, well appearing, non toxic in NAD  Head: Normocephalic and atraumatic  Eyes: PERRL, EOMI  Mouth: Oropharynx clear, handling secretions, no trismus  Neck: Supple, full ROM,   Pulmonary: Lungs clear to auscultation bilaterally, no wheezes, rales, or rhonchi. Not in respiratory distress  Cardiovascular:  Regular rate and rhythm, no murmurs, gallops, or rubs. 2+ distal pulses  Abdomen: Soft, non tender, non distended,   Extremities: Moves all extremities x 4. Warm and well perfused  Skin: warm and dry without rash  Neurologic: GCS 15,  Psych: Normal Affect      ------------------------------ ED COURSE/MEDICAL DECISION MAKING----------------------  Medications   ondansetron (ZOFRAN-ODT) disintegrating tablet 8 mg (has no administration in time range)   cefTRIAXone (ROCEPHIN) 250 mg in lidocaine 1 % 1 mL IM Injection (250 mg Intramuscular Given 07/11/18 0923)   azithromycin (ZITHROMAX) tablet 1,000 mg (1,000 mg Oral Given 07/11/18 0923)         ED COURSE:  Medical Decision Making:    37 year old female presenting to the emergency department for primary complaint of STD exposure.  Patient informed by partner that he was treated for an STD approximately 1 week ago of unknown source.  He was treated with Rocephin at that time as well as azithromycin.  Since that time she has noted foul vaginal smell, without dysuria.  Denies fevers chills chest pain shortness of breath or vaginal  discharge.  She is requesting treatment today for any possible infectious etiology.  Screening today did not show evidence of urinary tract infection or other any acute abnormalities.  Patient was treated empirically with antibiotics discharged with Zofran and advised follow-up with her family doctor.    Counseling:   The emergency provider has spoken with the patient and discussed today???s results, in addition to providing specific details for the plan of care and counseling regarding the diagnosis and prognosis.  Questions are answered at this time and they are agreeable with the plan.      --------------------------------- IMPRESSION AND DISPOSITION ---------------------------------    IMPRESSION  1. STD exposure        DISPOSITION  Disposition: Discharge to home  Patient condition is stable      NOTE: This report was transcribed using voice recognition software. Every effort was made to ensure accuracy; however, inadvertent computerized transcription errors may be present       Mordecai RasmussenKevin R Emoni Yang, DO  Resident  07/11/18 [phone]

## 2018-07-15 LAB — C.TRACHOMATIS N.GONORRHOEAE DNA, URINE
C. trachomatis DNA ,Urine: NEGATIVE
N. gonorrhoeae DNA, Urine: NEGATIVE

## 2018-09-07 ENCOUNTER — Ambulatory Visit
Admit: 2018-09-07 | Discharge: 2018-09-07 | Payer: PRIVATE HEALTH INSURANCE | Attending: Family Medicine | Primary: Family Medicine

## 2018-09-07 DIAGNOSIS — F900 Attention-deficit hyperactivity disorder, predominantly inattentive type: Secondary | ICD-10-CM

## 2018-09-07 MED ORDER — AMPHETAMINE-DEXTROAMPHET ER 20 MG PO CP24
20 MG | ORAL_CAPSULE | Freq: Every morning | ORAL | 0 refills | Status: AC
Start: 2018-09-07 — End: 2018-12-04

## 2018-09-07 MED ORDER — AMPHETAMINE-DEXTROAMPHET ER 20 MG PO CP24
20 MG | ORAL_CAPSULE | Freq: Every morning | ORAL | 0 refills | Status: DC
Start: 2018-09-07 — End: 2019-02-01

## 2018-09-07 MED ORDER — AMPHETAMINE-DEXTROAMPHET ER 20 MG PO CP24
20 MG | ORAL_CAPSULE | Freq: Every morning | ORAL | 0 refills | Status: AC
Start: 2018-09-07 — End: 2018-11-03

## 2018-09-07 NOTE — Progress Notes (Signed)
Subjective:     Patient: Christine Lynn is a 37 y.o. female    Agenda: ADHD/HTN      Patient is here to follow up on ADHD. Patient is currently taking: adderall XR gen 20 mg po daily. Patient reports medication is working well without side effect of palpitation, insomnia or irritability. Patient is requesting refill, today. Update:    Mood: patient began celexa in a high stress situation and it has been effective. Patient is stable on current dose of citalopram Update:       Hypertension   This is a chronic problem. The current episode started more than 1 year ago. The problem is unchanged. The problem is controlled. Pertinent negatives include no chest pain, headaches, palpitations or shortness of breath. Risk factors for coronary artery disease include obesity. Past treatments include calcium channel blockers and ACE inhibitors. The current treatment provides significant improvement. There are no compliance problems.         Review of Systems   Constitutional: Negative for activity change, chills and fever.   HENT: Negative for congestion, dental problem, rhinorrhea and sore throat.    Eyes: Negative for pain, redness and visual disturbance.   Respiratory: Negative for chest tightness, shortness of breath and wheezing.    Cardiovascular: Negative for chest pain, palpitations and leg swelling.   Gastrointestinal: Negative for abdominal pain, constipation, diarrhea, nausea and vomiting.   Endocrine: Negative for polydipsia, polyphagia and polyuria.   Genitourinary: Negative for dysuria.   Musculoskeletal: Negative for arthralgias and back pain.   Skin: Negative for rash.   Allergic/Immunologic: Negative for environmental allergies and food allergies.   Neurological: Negative for dizziness, syncope and headaches.   Psychiatric/Behavioral: Negative for decreased concentration, dysphoric mood and sleep disturbance. The patient is not hyperactive.         Allergies    Allergen Reactions   ??? Bactrim [Sulfamethoxazole-Trimethoprim] Itching     Burning sensation     Current Outpatient Medications on File Prior to Visit   Medication Sig Dispense Refill   ??? ondansetron (ZOFRAN) 4 MG tablet Take 2 tablets by mouth every 12 hours as needed for Nausea or Vomiting 30 tablet 0   ??? amLODIPine-benazepril (LOTREL) 5-20 MG per capsule Take 1 capsule by mouth daily 30 capsule 3   ??? levothyroxine (SYNTHROID) 125 MCG tablet Take 1 tablet by mouth daily 30 tablet 5   ??? chlorthalidone (HYGROTON) 25 MG tablet Take 1 tablet by mouth daily 30 tablet 3   ??? citalopram (CELEXA) 40 MG tablet Take 1.5 tablets by mouth daily 45 tablet 3   ??? Elastic Bandages & Supports (MEDICAL COMPRESSION SOCKS) MISC 2 each by Does not apply route daily 10-15 mm Hg 6 each 0   ??? loratadine (CLARITIN) 10 MG tablet Take 1 PO Daily for allergic symptoms. 30 tablet 1   ??? Multiple Vitamins-Minerals (THERAPEUTIC MULTIVITAMIN-MINERALS W/ IRON) TABS tablet Take 1 tablet by mouth daily 30 tablet 5   ??? buPROPion (WELLBUTRIN XL) 150 MG extended release tablet Take 1 tablet by mouth every morning (Patient not taking: Reported on 09/07/2018) 90 tablet 1     No current facility-administered medications on file prior to visit.       Past Medical History:   Diagnosis Date   ??? Abnormal Pap smear     2011   ??? Anxiety    ??? Depression    ??? Depression 03/28/2014   ??? Diabetes mellitus (HCC)     diet controlled   ???  Hypertension    ??? Hypothyroidism    ??? Obesity    ??? Thyroid disease     hypothyroid      Social History     Tobacco Use   ??? Smoking status: Never Smoker   ??? Smokeless tobacco: Never Used   Substance Use Topics   ??? Alcohol use: No          Objective:     BP 120/78    Pulse 91    Wt 273 lb 14.4 oz (124.2 kg)    LMP 08/13/2018 (Approximate)    SpO2 99%    BMI 42.90 kg/m??     Physical Exam   Constitutional: She is oriented to person, place, and time. She appears well-developed and well-nourished. No distress.   HENT:   Head: Normocephalic and  atraumatic.   Right Ear: External ear normal.   Left Ear: External ear normal.   Nose: Nose normal.   Eyes: Pupils are equal, round, and reactive to light. Conjunctivae and EOM are normal. Right eye exhibits no discharge. Left eye exhibits no discharge. No scleral icterus.   Neck: Normal range of motion. Neck supple. No tracheal deviation present. No thyromegaly present.   Cardiovascular: Normal rate, regular rhythm, normal heart sounds and intact distal pulses.   Pulmonary/Chest: Effort normal and breath sounds normal. No respiratory distress. She has no wheezes. She has no rales.   Abdominal: Soft. Bowel sounds are normal. She exhibits no distension and no mass. There is no tenderness. There is no guarding.   Musculoskeletal: Normal range of motion. She exhibits no edema, tenderness or deformity.   Lymphadenopathy:     She has no cervical adenopathy.   Neurological: She is alert and oriented to person, place, and time. Coordination normal.   Skin: Skin is warm and dry. No rash noted. She is not diaphoretic. No erythema. No pallor.   Psychiatric: She has a normal mood and affect. Judgment and thought content normal.       Assessment      Problem List Items Addressed This Visit     Attention deficit hyperactivity disorder (ADHD), predominantly inattentive type - Primary     Patient is medically stable on current dose of generic Adderall XR 20 mg po qam working well without side effect         Relevant Medications    amphetamine-dextroamphetamine (ADDERALL XR) 20 MG extended release capsule    amphetamine-dextroamphetamine (ADDERALL XR) 20 MG extended release capsule (Start on 10/04/2018)    amphetamine-dextroamphetamine (ADDERALL XR) 20 MG extended release capsule (Start on 11/04/2018)    Essential hypertension    Moderate episode of recurrent major depressive disorder (HCC)           Plan      1. Attention deficit hyperactivity disorder (ADHD), predominantly inattentive type  - amphetamine-dextroamphetamine (ADDERALL  XR) 20 MG extended release capsule; Take 1 capsule by mouth every morning for 30 days.  Dispense: 30 capsule; Refill: 0  - amphetamine-dextroamphetamine (ADDERALL XR) 20 MG extended release capsule; Take 1 capsule by mouth every morning for 30 days.  Dispense: 30 capsule; Refill: 0  - amphetamine-dextroamphetamine (ADDERALL XR) 20 MG extended release capsule; Take 1 capsule by mouth every morning for 30 days.  Dispense: 30 capsule; Refill: 0    2. Essential hypertension    3. Moderate episode of recurrent major depressive disorder [facility])  CCM    Loistine Chance, DO  09/05/18  9:14 AM   Return in about 3  months (around 12/07/2018) for Recheck ADHD 06/08/11 months ADHD as able.     Reviewed medication benefit/risk/how to take/most common side effects.        Diabetes a1c <9: not applicable  DMmicroalbumin/cr ratio within 1 year: not applicable  Hypertension controlled: BPgoal<140/<90 mgHg    yes  Pneumonia vaccine Up to date: not applicable  Colon cancer screening up to date: not applicable  Breast cancer screening up to date: not applicable    Mostrecent labs reviewed today Yes   I spent over 51% total time 25 minutes counseling (or coordinating care) and provided discussion regarding attention deficit/hyperactivity disorder in the management of attention deficit/hyperactivity disorder medication effects and side effects patient feels that the medication is working well without side effect and controlling her issues of lack of focus.  Hypertension management of hypertension medication effects and side effects and patient is currently at goal and does monitor this outside of the office and has blood pressures that are normal outside of the office as well.  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 Follow up is per plan.  Patient verbalized understanding, agreed with plan and thanked me for my time. Will follow.      Dictated using Dragon Naturally Speaking MedicalVersion 2.4  Proof read however unrecognized voice recognition errors may have occurred

## 2018-09-07 NOTE — Assessment & Plan Note (Signed)
Patient is medically stable on current dose of generic Adderall XR 20 mg po qam working well without side effect

## 2018-11-19 ENCOUNTER — Encounter

## 2018-11-20 NOTE — Telephone Encounter (Signed)
Last filled: 06/17/18  Dispensed: 30 w/ 3 refills  Last visit: 09/07/18  Next visit: 12/07/18

## 2018-11-21 MED ORDER — CHLORTHALIDONE 25 MG PO TABS
25 MG | ORAL_TABLET | Freq: Every day | ORAL | 3 refills | Status: DC
Start: 2018-11-21 — End: 2019-02-01

## 2018-11-21 MED ORDER — AMLODIPINE BESY-BENAZEPRIL HCL 5-20 MG PO CAPS
5-20 MG | ORAL_CAPSULE | Freq: Every day | ORAL | 3 refills | Status: DC
Start: 2018-11-21 — End: 2019-02-01

## 2018-12-07 ENCOUNTER — Encounter: Attending: Family Medicine | Primary: Family Medicine

## 2019-02-01 ENCOUNTER — Encounter

## 2019-02-02 MED ORDER — AMPHETAMINE-DEXTROAMPHET ER 20 MG PO CP24
20 MG | ORAL_CAPSULE | Freq: Every morning | ORAL | 0 refills | Status: AC
Start: 2019-02-02 — End: 2019-03-04

## 2019-02-02 MED ORDER — LEVOTHYROXINE SODIUM 125 MCG PO TABS
125 MCG | ORAL_TABLET | Freq: Every day | ORAL | 5 refills | Status: AC
Start: 2019-02-02 — End: ?

## 2019-02-02 MED ORDER — BUPROPION HCL ER (XL) 150 MG PO TB24
150 MG | ORAL_TABLET | Freq: Every morning | ORAL | 1 refills | Status: AC
Start: 2019-02-02 — End: ?

## 2019-02-02 MED ORDER — AMLODIPINE BESY-BENAZEPRIL HCL 5-20 MG PO CAPS
5-20 MG | ORAL_CAPSULE | Freq: Every day | ORAL | 3 refills | Status: AC
Start: 2019-02-02 — End: ?

## 2019-02-02 MED ORDER — CHLORTHALIDONE 25 MG PO TABS
25 MG | ORAL_TABLET | Freq: Every day | ORAL | 3 refills | Status: AC
Start: 2019-02-02 — End: ?

## 2019-02-02 MED ORDER — CITALOPRAM HYDROBROMIDE 40 MG PO TABS
40 MG | ORAL_TABLET | Freq: Every day | ORAL | 3 refills | Status: AC
Start: 2019-02-02 — End: ?

## 2019-02-02 NOTE — Telephone Encounter (Signed)
Last filled 11/21/2018, next appointment 03/08/2019

## 2019-02-02 NOTE — Telephone Encounter (Signed)
From: Earnestine Leys  To: Loistine Chance, DO  Sent: 02/01/2019 9:04 PM EST  Subject: Prescription Question    I recently relocated to [facility] r/t work . I am currently seeking a PCP here in [place]. I was wondering if you are able to provide me with a referral for care as will as a 30 day supply of my medications to continue while I???m seeking care here ?

## 2019-03-08 ENCOUNTER — Encounter: Attending: Family Medicine | Primary: Family Medicine

## 2019-12-10 ENCOUNTER — Emergency Department (HOSPITAL_COMMUNITY): Payer: No Typology Code available for payment source

## 2019-12-10 ENCOUNTER — Other Ambulatory Visit: Payer: Self-pay

## 2019-12-10 ENCOUNTER — Emergency Department (HOSPITAL_COMMUNITY)
Admission: EM | Admit: 2019-12-10 | Discharge: 2019-12-11 | Disposition: A | Discharge status: Home / Self Care | Payer: No Typology Code available for payment source | Attending: Emergency Medicine | Admitting: Emergency Medicine

## 2019-12-10 DIAGNOSIS — Y998 Other external cause status: Secondary | ICD-10-CM | POA: Diagnosis not present

## 2019-12-10 DIAGNOSIS — Y9389 Activity, other specified: Secondary | ICD-10-CM | POA: Diagnosis not present

## 2019-12-10 DIAGNOSIS — Z79899 Other long term (current) drug therapy: Secondary | ICD-10-CM | POA: Insufficient documentation

## 2019-12-10 DIAGNOSIS — S2231XA Fracture of one rib, right side, initial encounter for closed fracture: Secondary | ICD-10-CM | POA: Diagnosis not present

## 2019-12-10 DIAGNOSIS — Y92410 Unspecified street and highway as the place of occurrence of the external cause: Secondary | ICD-10-CM | POA: Diagnosis not present

## 2019-12-10 DIAGNOSIS — M549 Dorsalgia, unspecified: Secondary | ICD-10-CM | POA: Diagnosis not present

## 2019-12-10 DIAGNOSIS — S299XXA Unspecified injury of thorax, initial encounter: Secondary | ICD-10-CM | POA: Diagnosis present

## 2019-12-10 DIAGNOSIS — M542 Cervicalgia: Secondary | ICD-10-CM | POA: Insufficient documentation

## 2019-12-10 LAB — CBC WITH DIFFERENTIAL/PLATELET
Abs Immature Granulocytes: 0.02 10*3/uL (ref 0.00–0.07)
Basophils Absolute: 0 10*3/uL (ref 0.0–0.1)
Basophils Relative: 1 %
Eosinophils Absolute: 0.1 10*3/uL (ref 0.0–0.5)
Eosinophils Relative: 2 %
HCT: 39.6 % (ref 36.0–46.0)
Hemoglobin: 12.3 g/dL (ref 12.0–15.0)
Immature Granulocytes: 0 %
Lymphocytes Relative: 36 %
Lymphs Abs: 2.1 10*3/uL (ref 0.7–4.0)
MCH: 27.6 pg (ref 26.0–34.0)
MCHC: 31.1 g/dL (ref 30.0–36.0)
MCV: 89 fL (ref 80.0–100.0)
Monocytes Absolute: 0.4 10*3/uL (ref 0.1–1.0)
Monocytes Relative: 7 %
Neutro Abs: 3.2 10*3/uL (ref 1.7–7.7)
Neutrophils Relative %: 54 %
Platelets: 342 10*3/uL (ref 150–400)
RBC: 4.45 MIL/uL (ref 3.87–5.11)
RDW: 14.5 % (ref 11.5–15.5)
WBC: 5.9 10*3/uL (ref 4.0–10.5)
nRBC: 0 % (ref 0.0–0.2)

## 2019-12-10 LAB — BASIC METABOLIC PANEL
Anion gap: 11 (ref 5–15)
BUN: 12 mg/dL (ref 6–20)
CO2: 29 mmol/L (ref 22–32)
Calcium: 9.7 mg/dL (ref 8.9–10.3)
Chloride: 99 mmol/L (ref 98–111)
Creatinine, Ser: 0.89 mg/dL (ref 0.44–1.00)
GFR calc Af Amer: >60 mL/min (ref >60)
GFR calc non Af Amer: >60 mL/min (ref >60)
Glucose, Bld: 105 mg/dL — ABNORMAL HIGH (ref 70–99)
Potassium: 3.3 mmol/L — ABNORMAL LOW (ref 3.5–5.1)
Sodium: 139 mmol/L (ref 135–145)

## 2019-12-10 LAB — I-STAT BETA HCG BLOOD, ED (MC, WL, AP ONLY): I-stat hCG, quantitative: <5 m[IU]/mL (ref <5)

## 2019-12-10 MED ORDER — HYDROCODONE-ACETAMINOPHEN 5-325 MG PO TABS: 1.0000 | ORAL_TABLET | Freq: Three times a day (TID) | ORAL | 0 refills | Status: AC | PRN

## 2019-12-10 MED ORDER — METHOCARBAMOL 500 MG PO TABS: 500.0000 mg | ORAL_TABLET | Freq: Two times a day (BID) | ORAL | 0 refills | Status: AC

## 2019-12-10 MED ORDER — HYDROCODONE-ACETAMINOPHEN 5-325 MG PO TABS
1.0000 | ORAL_TABLET | Freq: Once | ORAL | Status: AC
  Administered 2019-12-10: 1 via ORAL
  Filled 2019-12-10: qty 1

## 2019-12-10 MED ORDER — ACETAMINOPHEN ER 650 MG PO TBCR: 650.0000 mg | EXTENDED_RELEASE_TABLET | Freq: Three times a day (TID) | ORAL | 0 refills | Status: AC | PRN

## 2019-12-10 MED ORDER — NAPROXEN 375 MG PO TABS: 375.0000 mg | ORAL_TABLET | Freq: Two times a day (BID) | ORAL | 0 refills | Status: DC

## 2019-12-10 NOTE — ED Notes (Signed)
Patient transported to CT 
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
## 2019-12-10 NOTE — ED Notes (Signed)
Patient transported to X-ray 
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
## 2020-01-02 ENCOUNTER — Other Ambulatory Visit: Payer: Self-pay

## 2020-01-02 ENCOUNTER — Emergency Department (HOSPITAL_BASED_OUTPATIENT_CLINIC_OR_DEPARTMENT_OTHER): Payer: PRIVATE HEALTH INSURANCE

## 2020-01-02 ENCOUNTER — Emergency Department (HOSPITAL_BASED_OUTPATIENT_CLINIC_OR_DEPARTMENT_OTHER)
Admission: EM | Admit: 2020-01-02 | Discharge: 2020-01-02 | Disposition: A | Discharge status: Home / Self Care | Payer: PRIVATE HEALTH INSURANCE | Attending: Emergency Medicine | Admitting: Emergency Medicine

## 2020-01-02 ENCOUNTER — Encounter (HOSPITAL_BASED_OUTPATIENT_CLINIC_OR_DEPARTMENT_OTHER): Payer: Self-pay | Admitting: Emergency Medicine

## 2020-01-02 DIAGNOSIS — I10 Essential (primary) hypertension: Secondary | ICD-10-CM | POA: Diagnosis not present

## 2020-01-02 DIAGNOSIS — Z20822 Contact with and (suspected) exposure to covid-19: Secondary | ICD-10-CM | POA: Insufficient documentation

## 2020-01-02 DIAGNOSIS — R079 Chest pain, unspecified: Secondary | ICD-10-CM | POA: Diagnosis not present

## 2020-01-02 DIAGNOSIS — R110 Nausea: Secondary | ICD-10-CM | POA: Insufficient documentation

## 2020-01-02 DIAGNOSIS — Z79899 Other long term (current) drug therapy: Secondary | ICD-10-CM | POA: Insufficient documentation

## 2020-01-02 DIAGNOSIS — E119 Type 2 diabetes mellitus without complications: Secondary | ICD-10-CM | POA: Insufficient documentation

## 2020-01-02 HISTORY — DX: Essential (primary) hypertension: I10

## 2020-01-02 HISTORY — DX: Type 2 diabetes mellitus without complications: E11.9

## 2020-01-02 HISTORY — DX: Disorder of thyroid, unspecified: E07.9

## 2020-01-02 LAB — CBC
HCT: 36.9 % (ref 36.0–46.0)
Hemoglobin: 11.8 g/dL — ABNORMAL LOW (ref 12.0–15.0)
MCH: 28.1 pg (ref 26.0–34.0)
MCHC: 32 g/dL (ref 30.0–36.0)
MCV: 87.9 fL (ref 80.0–100.0)
Platelets: 294 10*3/uL (ref 150–400)
RBC: 4.2 MIL/uL (ref 3.87–5.11)
RDW: 15.1 % (ref 11.5–15.5)
WBC: 4.6 10*3/uL (ref 4.0–10.5)
nRBC: 0 % (ref 0.0–0.2)

## 2020-01-02 LAB — BASIC METABOLIC PANEL
Anion gap: 7 (ref 5–15)
BUN: 10 mg/dL (ref 6–20)
CO2: 25 mmol/L (ref 22–32)
Calcium: 9 mg/dL (ref 8.9–10.3)
Chloride: 106 mmol/L (ref 98–111)
Creatinine, Ser: 0.93 mg/dL (ref 0.44–1.00)
GFR calc Af Amer: >60 mL/min (ref >60)
GFR calc non Af Amer: >60 mL/min (ref >60)
Glucose, Bld: 104 mg/dL — ABNORMAL HIGH (ref 70–99)
Potassium: 3.4 mmol/L — ABNORMAL LOW (ref 3.5–5.1)
Sodium: 138 mmol/L (ref 135–145)

## 2020-01-02 LAB — PREGNANCY, URINE: Preg Test, Ur: NEGATIVE

## 2020-01-02 LAB — D-DIMER, QUANTITATIVE: D-Dimer, Quant: 2.5 ug/mL-FEU — ABNORMAL HIGH (ref 0.00–0.50)

## 2020-01-02 LAB — SARS CORONAVIRUS 2 AG (30 MIN TAT): SARS Coronavirus 2 Ag: NEGATIVE

## 2020-01-02 LAB — TROPONIN I (HIGH SENSITIVITY): Troponin I (High Sensitivity): <2 ng/L (ref <18)

## 2020-01-02 MED ORDER — IOHEXOL 350 MG/ML SOLN
100.0000 mL | Freq: Once | INTRAVENOUS | Status: AC | PRN
  Administered 2020-01-02: 09:00:00 87 mL via INTRAVENOUS

## 2020-01-02 MED ORDER — PROMETHAZINE HCL 25 MG PO TABS: 12.5000 mg | ORAL_TABLET | Freq: Three times a day (TID) | ORAL | 0 refills | Status: AC | PRN

## 2020-01-02 MED ORDER — PROMETHAZINE HCL 25 MG/ML IJ SOLN
12.5000 mg | Freq: Once | INTRAMUSCULAR | Status: AC
  Administered 2020-01-02: 12.5 mg via INTRAVENOUS
  Filled 2020-01-02: qty 1

## 2020-01-02 NOTE — ED Triage Notes (Signed)
Intermittent L side chest pain x 1 week with nausea. Hurts worse with movement.
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
## 2020-01-03 LAB — NOVEL CORONAVIRUS, NAA (HOSP ORDER, SEND-OUT TO REF LAB; TAT 18-24 HRS): SARS-CoV-2, NAA: NOT DETECTED

## 2020-10-07 ENCOUNTER — Other Ambulatory Visit: Payer: Self-pay

## 2020-10-07 ENCOUNTER — Encounter (HOSPITAL_BASED_OUTPATIENT_CLINIC_OR_DEPARTMENT_OTHER): Payer: Self-pay | Admitting: *Deleted

## 2020-10-07 ENCOUNTER — Emergency Department (HOSPITAL_BASED_OUTPATIENT_CLINIC_OR_DEPARTMENT_OTHER)
Admission: EM | Admit: 2020-10-07 | Discharge: 2020-10-07 | Disposition: A | Discharge status: Home / Self Care | Payer: 59 | Attending: Emergency Medicine | Admitting: Emergency Medicine

## 2020-10-07 DIAGNOSIS — I10 Essential (primary) hypertension: Secondary | ICD-10-CM | POA: Insufficient documentation

## 2020-10-07 DIAGNOSIS — T783XXA Angioneurotic edema, initial encounter: Secondary | ICD-10-CM | POA: Diagnosis not present

## 2020-10-07 DIAGNOSIS — Z7982 Long term (current) use of aspirin: Secondary | ICD-10-CM | POA: Insufficient documentation

## 2020-10-07 DIAGNOSIS — Z79899 Other long term (current) drug therapy: Secondary | ICD-10-CM | POA: Insufficient documentation

## 2020-10-07 DIAGNOSIS — E119 Type 2 diabetes mellitus without complications: Secondary | ICD-10-CM | POA: Diagnosis not present

## 2020-10-07 DIAGNOSIS — R220 Localized swelling, mass and lump, head: Secondary | ICD-10-CM | POA: Diagnosis present

## 2020-10-07 LAB — CBC WITH DIFFERENTIAL/PLATELET
Abs Immature Granulocytes: 0.01 10*3/uL (ref 0.00–0.07)
Basophils Absolute: 0 10*3/uL (ref 0.0–0.1)
Basophils Relative: 0 %
Eosinophils Absolute: 0.1 10*3/uL (ref 0.0–0.5)
Eosinophils Relative: 1 %
HCT: 37.6 % (ref 36.0–46.0)
Hemoglobin: 11.9 g/dL — ABNORMAL LOW (ref 12.0–15.0)
Immature Granulocytes: 0 %
Lymphocytes Relative: 32 %
Lymphs Abs: 2.5 10*3/uL (ref 0.7–4.0)
MCH: 27.8 pg (ref 26.0–34.0)
MCHC: 31.6 g/dL (ref 30.0–36.0)
MCV: 87.9 fL (ref 80.0–100.0)
Monocytes Absolute: 0.5 10*3/uL (ref 0.1–1.0)
Monocytes Relative: 7 %
Neutro Abs: 4.5 10*3/uL (ref 1.7–7.7)
Neutrophils Relative %: 60 %
Platelets: 321 10*3/uL (ref 150–400)
RBC: 4.28 MIL/uL (ref 3.87–5.11)
RDW: 14.1 % (ref 11.5–15.5)
WBC: 7.6 10*3/uL (ref 4.0–10.5)
nRBC: 0 % (ref 0.0–0.2)

## 2020-10-07 LAB — BASIC METABOLIC PANEL
Anion gap: 12 (ref 5–15)
BUN: 13 mg/dL (ref 6–20)
CO2: 27 mmol/L (ref 22–32)
Calcium: 9.4 mg/dL (ref 8.9–10.3)
Chloride: 99 mmol/L (ref 98–111)
Creatinine, Ser: 0.95 mg/dL (ref 0.44–1.00)
GFR, Estimated: >60 mL/min (ref >60)
Glucose, Bld: 104 mg/dL — ABNORMAL HIGH (ref 70–99)
Potassium: 2.8 mmol/L — ABNORMAL LOW (ref 3.5–5.1)
Sodium: 138 mmol/L (ref 135–145)

## 2020-10-07 LAB — PREGNANCY, URINE: Preg Test, Ur: NEGATIVE

## 2020-10-07 MED ORDER — DIPHENHYDRAMINE HCL 50 MG/ML IJ SOLN
25.0000 mg | Freq: Once | INTRAMUSCULAR | Status: AC
  Administered 2020-10-07: 25 mg via INTRAVENOUS
  Filled 2020-10-07: qty 1

## 2020-10-07 MED ORDER — POTASSIUM CHLORIDE CRYS ER 20 MEQ PO TBCR
40.0000 meq | EXTENDED_RELEASE_TABLET | Freq: Once | ORAL | Status: AC
  Administered 2020-10-07: 40 meq via ORAL
  Filled 2020-10-07: qty 2

## 2020-10-07 MED ORDER — METHYLPREDNISOLONE SODIUM SUCC 125 MG IJ SOLR
125.0000 mg | Freq: Once | INTRAMUSCULAR | Status: AC
  Administered 2020-10-07: 125 mg via INTRAVENOUS
  Filled 2020-10-07: qty 2

## 2020-10-07 MED ORDER — FAMOTIDINE IN NACL 20-0.9 MG/50ML-% IV SOLN
20.0000 mg | Freq: Once | INTRAVENOUS | Status: AC
  Administered 2020-10-07: 20 mg via INTRAVENOUS
  Filled 2020-10-07: qty 50

## 2020-10-07 NOTE — Discharge Instructions (Signed)
If you have any worsening of her swelling, you develop any tongue swelling, mouth swelling, difficulty in breathing or other new concerning symptom, please return immediately to the nearest emergency room for reassessment.  You should never take any ACE inhibitor again in your life.  Please follow-up with your primary doctor regarding blood pressure management.

## 2020-10-07 NOTE — ED Triage Notes (Signed)
Pt reports upper lip began swelling today around 2 pm. States she took benadryl (x2) and claritin. States lip is continuing to swell and her mouth feels tight. Voice clear

## 2020-10-07 NOTE — ED Provider Notes (Signed)
[facility] EMERGENCY DEPARTMENT Provider Note   CSN: [id number] Arrival date & time: 10/07/20  2043     History Chief Complaint  Patient presents with  . Oral Swelling    Teresa Vega is a 39 y.o. female.  Presenting to ER with concern for upper lip swelling.  Symptoms started around 2:00 today, has been progressively getting worse, took oral Benadryl and Claritin with no improvement.  Had sensation that her mouth felt tight but denied any throat swelling, tongue swelling, lower lip swelling.  No difficulty speaking, no difficulty swallowing.  No difficulty in breathing.  Has history of hypertension, takes Lotrel which is amlodipine benazepril combination pill.  No prior history of angioedema, anaphylaxis.  HPI     Past Medical History:  Diagnosis Date  . Diabetes mellitus without complication (HCC)   . Hypertension   . Thyroid disease     There are no problems to display for this patient.   History reviewed. No pertinent surgical history.   OB History   No obstetric history on file.     No family history on file.  Social History   Tobacco Use  . Smoking status: Never Smoker  . Smokeless tobacco: Never Used  Substance Use Topics  . Alcohol use: Not Currently  . Drug use: Never    Home Medications Prior to Admission medications   Medication Sig Start Date End Date Taking? Authorizing Provider  chlorthalidone (HYGROTON) 25 MG tablet Take 25 mg by mouth daily. 11/10/19  Yes [provider]  sertraline (ZOLOFT) 50 MG tablet Take 50 mg by mouth daily. 11/10/19  Yes [provider]  SPRINTEC 28 0.25-35 MG-MCG tablet Take 1 tablet by mouth daily. 09/04/19  Yes [provider]  amLODipine-benazepril (LOTREL) 5-20 MG capsule Take 1 capsule by mouth daily. 11/10/19 10/07/20 Yes [provider]  acetaminophen (TYLENOL 8 HOUR) 650 MG CR tablet Take 1 tablet (650 mg total) by mouth every 8 (eight) hours as needed. 12/10/19    Derwood Kaplan, MD  ASPIRIN LOW DOSE 81 MG EC tablet Take 81 mg by mouth daily. 08/11/20   [provider]  levothyroxine (SYNTHROID) 125 MCG tablet Take 125 mcg by mouth daily. 08/11/20   [provider]  meloxicam (MOBIC) 7.5 MG tablet Take 7.5 mg by mouth daily. 09/08/20   [provider]  methocarbamol (ROBAXIN) 500 MG tablet Take 1 tablet (500 mg total) by mouth 2 (two) times daily. 12/10/19   Derwood Kaplan, MD  naproxen (NAPROSYN) 375 MG tablet Take 1 tablet (375 mg total) by mouth 2 (two) times daily. 12/10/19   Derwood Kaplan, MD  neomycin-polymyxin b-dexamethasone (MAXITROL) 3.5-10000-0.1 OINT SMARTSIG:Sparingly In Eye(s) Every Night PRN 09/19/20   [provider]  prednisoLONE acetate (PRED FORTE) 1 % ophthalmic suspension  09/19/20   [provider]  promethazine (PHENERGAN) 25 MG tablet Take 0.5-1 tablets (12.5-25 mg total) by mouth every 8 (eight) hours as needed for nausea. 01/02/20   Benjiman Core, MD  rosuvastatin (CRESTOR) 10 MG tablet Take 10 mg by mouth at bedtime. 10/03/20   [provider]    Allergies    Sulfamethoxazole-trimethoprim and Ace inhibitors  Review of Systems   Review of Systems  Constitutional: Negative for chills and fever.  HENT: Negative for ear pain and sore throat.        Swelling  Eyes: Negative for pain and visual disturbance.  Respiratory: Negative for cough and shortness of breath.   Cardiovascular: Negative for chest pain  and palpitations.  Gastrointestinal: Negative for abdominal pain and vomiting.  Genitourinary: Negative for dysuria and hematuria.  Musculoskeletal: Negative for arthralgias and back pain.  Skin: Negative for color change and rash.  Neurological: Negative for seizures and syncope.  All other systems reviewed and are negative.   Physical Exam Updated Vital Signs BP 122/87 (BP Location: Right Arm)   Pulse 99   Temp 98.8 F (37.1 C) (Oral)   Resp 18   Ht 5\' 8"  (1.727  m)   Wt 128.4 kg   LMP 09/20/2020   SpO2 99%   BMI 43.03 kg/m   Physical Exam Vitals and nursing note reviewed.  Constitutional:      General: She is not in acute distress.    Appearance: She is well-developed.  HENT:     Head: Normocephalic and atraumatic.     Comments: Upper lip swelling noted, no tongue swelling, no posterior oropharynx swelling, no lower lip swelling, neck is supple without swelling Eyes:     Conjunctiva/sclera: Conjunctivae normal.  Cardiovascular:     Rate and Rhythm: Normal rate and regular rhythm.     Heart sounds: No murmur heard.   Pulmonary:     Effort: Pulmonary effort is normal. No respiratory distress.     Breath sounds: Normal breath sounds.  Abdominal:     Palpations: Abdomen is soft.     Tenderness: There is no abdominal tenderness.  Musculoskeletal:     Cervical back: Neck supple.  Skin:    General: Skin is warm and dry.  Neurological:     Mental Status: She is alert.  Psychiatric:        Mood and Affect: Mood normal.        Behavior: Behavior normal.     ED Results / Procedures / Treatments   Labs (all labs ordered are listed, but only abnormal results are displayed) Labs Reviewed  CBC WITH DIFFERENTIAL/PLATELET - Abnormal; Notable for the following components:      Result Value   Hemoglobin 11.9 (*)    All other components within normal limits  BASIC METABOLIC PANEL - Abnormal; Notable for the following components:   Potassium 2.8 (*)    Glucose, Bld 104 (*)    All other components within normal limits  PREGNANCY, URINE    EKG None  Radiology No results found.  Procedures Procedures (including critical care time)  Medications Ordered in ED Medications  diphenhydrAMINE (BENADRYL) injection 25 mg (25 mg Intravenous Given 10/07/20 2213)  methylPREDNISolone sodium succinate (SOLU-MEDROL) 125 mg/2 mL injection 125 mg (125 mg Intravenous Given 10/07/20 2213)  famotidine (PEPCID) IVPB 20 mg premix (0 mg Intravenous Stopped  10/07/20 2244)  potassium chloride SA (KLOR-CON) CR tablet 40 mEq (40 mEq Oral Given 10/07/20 2235)    ED Course  I have reviewed the triage vital signs and the nursing notes.  Pertinent labs & imaging results that were available during my care of the patient were reviewed by me and considered in my medical decision making (see chart for details).    MDM Rules/Calculators/A&P                         40 year old lady presents to ER with concern for upper lip swelling.  On physical exam, noted swelling to her upper lip concerning for angioedema.  No other oral mucosal involved.  She is on ACE inhibitor for blood pressure.  Suspect this is most likely culprit.  Provided steroids, antihistamines in  case that this is some other allergic reaction however patient did not have any improvement after after receiving these medications.  On reassessment, she continues to have some mild to moderate upper lip swelling but has had no worsening of her condition.  Given the swelling is isolated to upper lip, no tongue or other oropharynx involvement, believe she can be observed at home.  Instructed to notify her primary doctor, discontinue ACE inhibitor use.  Reviewed return precautions in detail with patient and partner at bedside.    After the discussed management above, the patient was determined to be safe for discharge.  The patient was in agreement with this plan and all questions regarding their care were answered.  ED return precautions were discussed and the patient will return to the ED with any significant worsening of condition.    Final Clinical Impression(s) / ED Diagnoses Final diagnoses:  Angioedema, initial encounter    Rx / DC Orders ED Discharge Orders    None       Milagros Loll, MD 10/07/20 2315

## 2021-02-11 IMAGING — CT CT ANGIO CHEST
2 of 8 series · 17 of 36 positions shown · IV contrast (Omnipaque)
Comparison: Chest radiograph-earlier same day

CLINICAL DATA: Shortness of breath. Elevated D-dimer. Evaluate for
pulmonary embolism.

EXAM:
CT ANGIOGRAPHY CHEST WITH CONTRAST
TECHNIQUE: Multidetector CT imaging of the chest was performed using the
standard protocol during bolus administration of intravenous
contrast. Multiplanar CT image reconstructions and MIPs were
obtained to evaluate the vascular anatomy.
CONTRAST:  87 mL OMNIPAQUE IOHEXOL 350 MG/ML SOLN

[Series 6: pe thins · axial · 0.85mm/px · z∈[-239,-7]mm · 16 of 260 slices shown]
[im 14/260  lung]
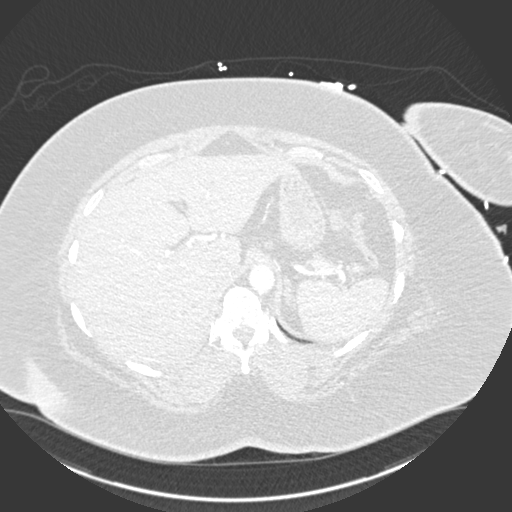
[im 28/260  mediastinal]
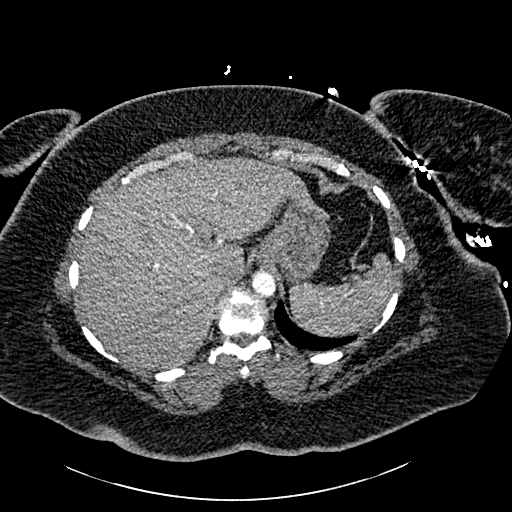
[im 41/260  lung]
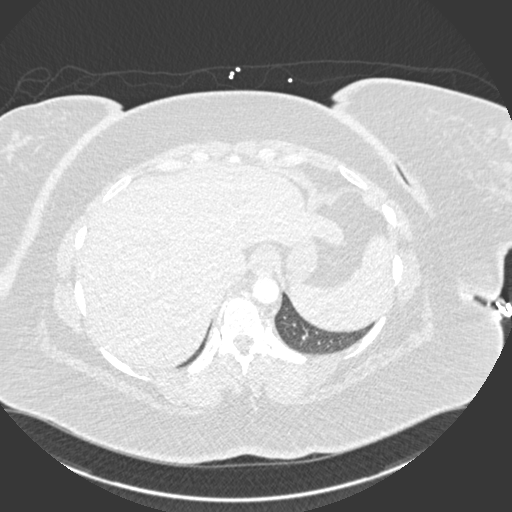
[im 55/260  mediastinal]
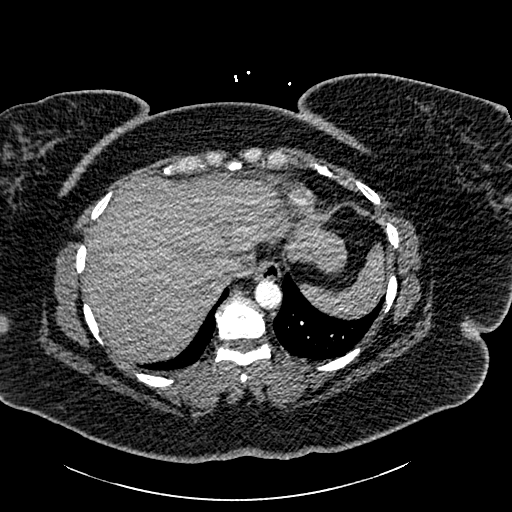
[im 82/260  lung]
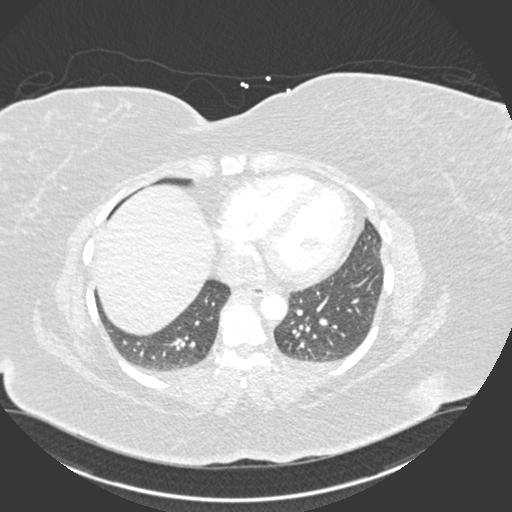
[im 96/260  mediastinal]
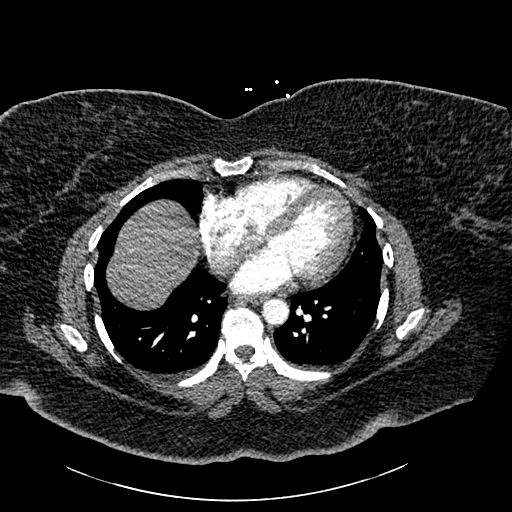
[im 110/260  lung]
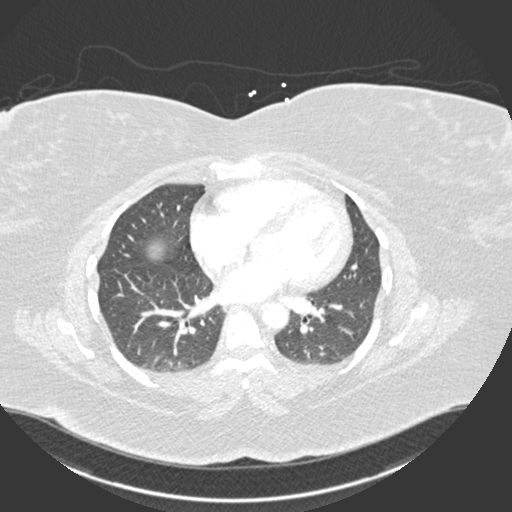
[im 123/260  mediastinal]
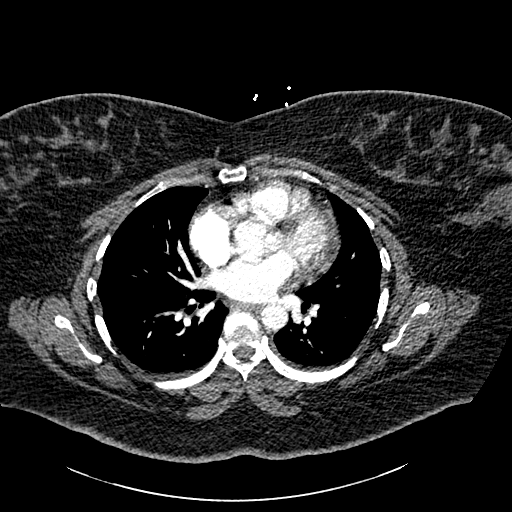
[im 137/260  lung]
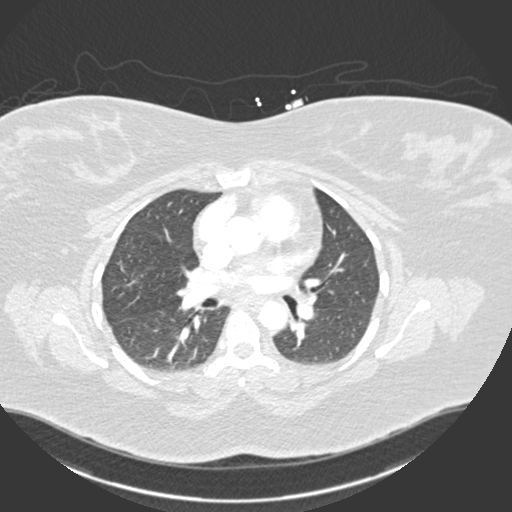
[im 150/260  mediastinal]
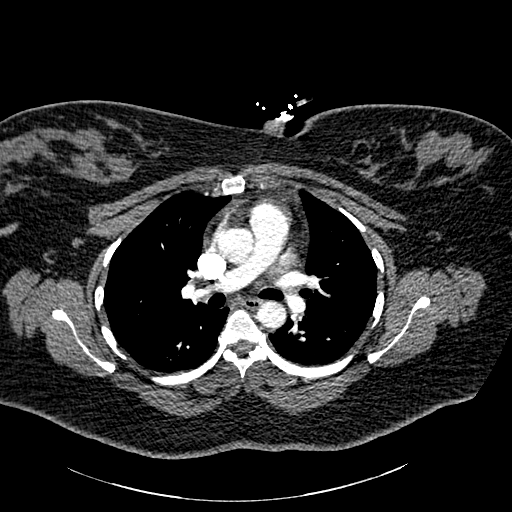
[im 164/260  lung]
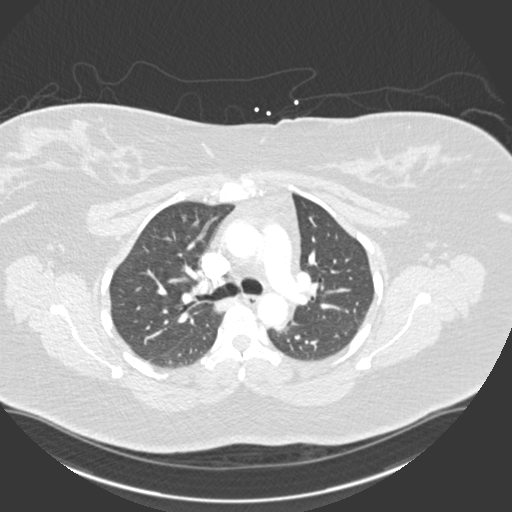
[im 178/260  mediastinal]
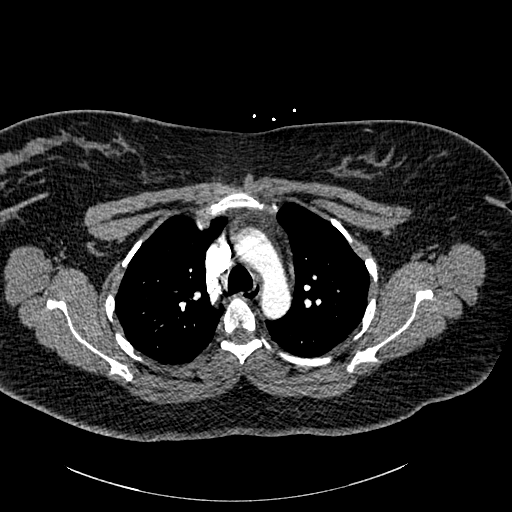
[im 205/260  lung]
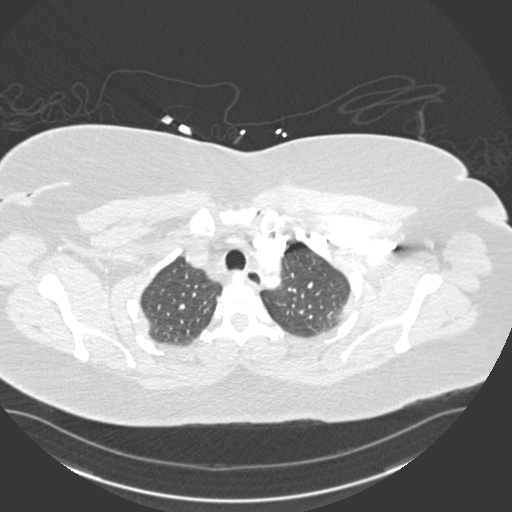
[im 219/260  mediastinal]
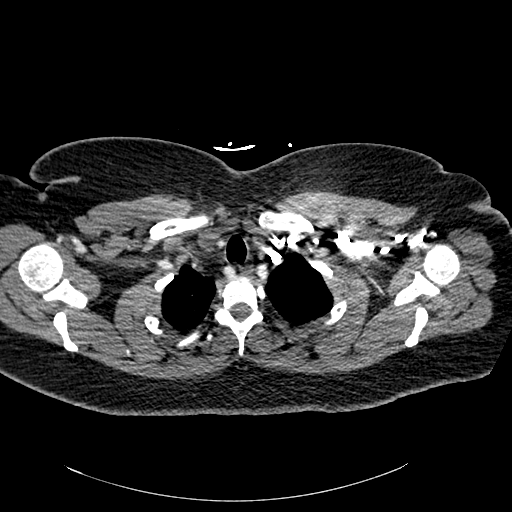
[im 232/260  lung]
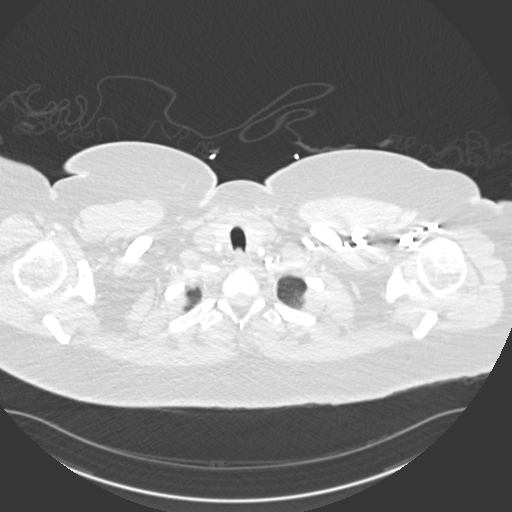
[im 246/260  mediastinal]
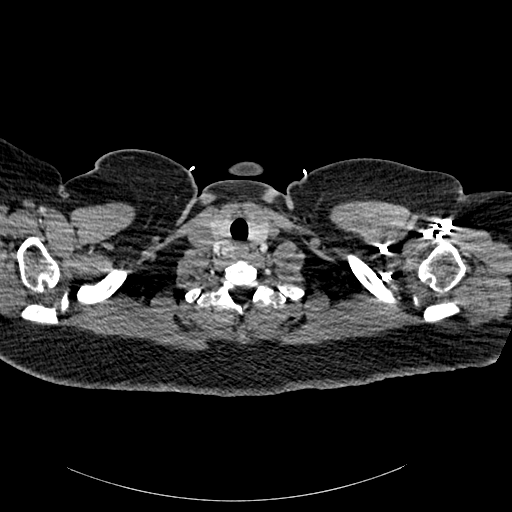

[Series 7: pe coronal mpr · coronal · 0.49mm/px · 1 of 151 slices shown]
[im 76/151  mediastinal]
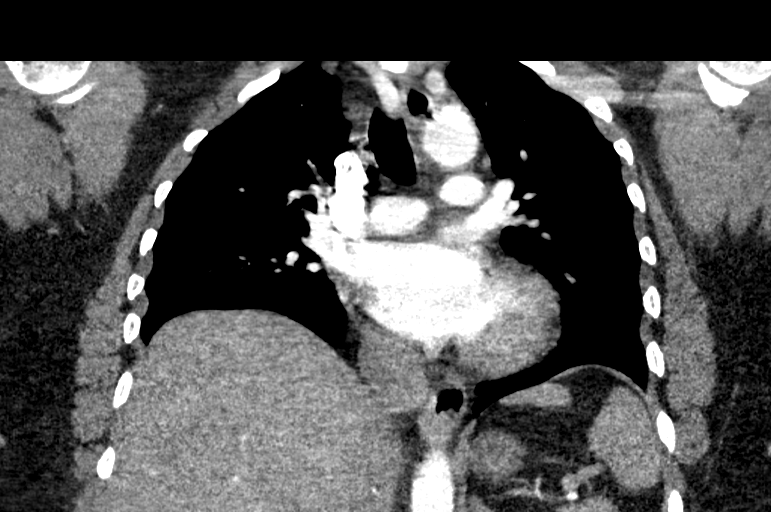

[17 of 36 positions shown; findings below may reference images not displayed]

FINDINGS: Vascular Findings:

There is adequate opacification of the pulmonary arterial system
with the main pulmonary artery measuring 226 Hounsfield units. There
are no discrete filling defects within the pulmonary arterial tree
to the level the bilateral subsegmental pulmonary arteries to
suggest pulmonary embolism. Evaluation of the distal subsegmental
pulmonary arteries is degraded secondary to suboptimal vessel
opacification as well as quantum mottle artifact due to patient body
habitus. Normal caliber of the main pulmonary artery.

Borderline cardiomegaly.  No pericardial effusion.

Normal caliber of the thoracic aorta. No evidence of thoracic aortic
dissection or periaortic stranding on this nongated examination.

Incidental note made of an aberrant right subclavian artery which
courses posterior to both the trachea and the esophagus. The branch
vessels of the aortic arch appear patent throughout their imaged
courses.

Review of the MIP images confirms the above findings.

----------------------------------------------------------------------------------

Nonvascular Findings:

Mediastinum/Lymph Nodes: No bulky mediastinal, hilar or axillary
lymph adenopathy.

Lungs/Pleura: Minimal dependent subpleural ground-glass atelectasis.
No discrete focal airspace opacities. No pleural effusion or
pneumothorax. The central pulmonary airways appear widely patent. No
discrete pulmonary nodules.

Upper abdomen: Limited early arterial phase evaluation of the upper
abdomen demonstrates a small hiatal hernia.

Musculoskeletal: No acute or aggressive osseous abnormalities. Mild
multilevel mid and lower thoracic spine DDD with disc space height
loss, endplate irregularity and sclerosis. Mild degenerative change
of the bilateral acromioclavicular joints. Regional soft tissues
appear normal. Normal appearance of the thyroid gland.
IMPRESSION: 1. No acute cardiopulmonary disease. Specifically, no evidence of
pulmonary embolism.
2. Incidentally noted aberrant right subclavian artery, a relatively
common congenital variant typically of no clinical significance.
3. Borderline cardiomegaly.
4. Small hiatal hernia.

## 2021-09-14 ENCOUNTER — Emergency Department (HOSPITAL_BASED_OUTPATIENT_CLINIC_OR_DEPARTMENT_OTHER)
Admission: EM | Admit: 2021-09-14 | Discharge: 2021-09-14 | Disposition: A | Discharge status: Home / Self Care | Payer: Federal, State, Local not specified - PPO | Attending: Emergency Medicine | Admitting: Emergency Medicine

## 2021-09-14 ENCOUNTER — Other Ambulatory Visit: Payer: Self-pay

## 2021-09-14 ENCOUNTER — Encounter (HOSPITAL_BASED_OUTPATIENT_CLINIC_OR_DEPARTMENT_OTHER): Payer: Self-pay | Admitting: Emergency Medicine

## 2021-09-14 DIAGNOSIS — Z79899 Other long term (current) drug therapy: Secondary | ICD-10-CM | POA: Diagnosis not present

## 2021-09-14 DIAGNOSIS — R1013 Epigastric pain: Secondary | ICD-10-CM | POA: Insufficient documentation

## 2021-09-14 DIAGNOSIS — Z7982 Long term (current) use of aspirin: Secondary | ICD-10-CM | POA: Insufficient documentation

## 2021-09-14 DIAGNOSIS — U071 COVID-19: Secondary | ICD-10-CM | POA: Diagnosis not present

## 2021-09-14 DIAGNOSIS — R519 Headache, unspecified: Secondary | ICD-10-CM | POA: Diagnosis present

## 2021-09-14 DIAGNOSIS — E119 Type 2 diabetes mellitus without complications: Secondary | ICD-10-CM | POA: Insufficient documentation

## 2021-09-14 DIAGNOSIS — I10 Essential (primary) hypertension: Secondary | ICD-10-CM | POA: Insufficient documentation

## 2021-09-14 HISTORY — DX: Anxiety disorder, unspecified: F41.9

## 2021-09-14 HISTORY — DX: Depression, unspecified: F32.A

## 2021-09-14 LAB — LIPASE, BLOOD: Lipase: 48 U/L (ref 11–51)

## 2021-09-14 LAB — COMPREHENSIVE METABOLIC PANEL
ALT: 11 U/L (ref 0–44)
AST: 13 U/L — ABNORMAL LOW (ref 15–41)
Albumin: 3.5 g/dL (ref 3.5–5.0)
Alkaline Phosphatase: 43 U/L (ref 38–126)
Anion gap: 7 (ref 5–15)
BUN: 12 mg/dL (ref 6–20)
CO2: 26 mmol/L (ref 22–32)
Calcium: 9 mg/dL (ref 8.9–10.3)
Chloride: 105 mmol/L (ref 98–111)
Creatinine, Ser: 0.83 mg/dL (ref 0.44–1.00)
GFR, Estimated: >60 mL/min (ref >60)
Glucose, Bld: 90 mg/dL (ref 70–99)
Potassium: 3.6 mmol/L (ref 3.5–5.1)
Sodium: 138 mmol/L (ref 135–145)
Total Bilirubin: 0.4 mg/dL (ref 0.3–1.2)
Total Protein: 7.6 g/dL (ref 6.5–8.1)

## 2021-09-14 LAB — CBC WITH DIFFERENTIAL/PLATELET
Abs Immature Granulocytes: 0.01 10*3/uL (ref 0.00–0.07)
Basophils Absolute: 0 10*3/uL (ref 0.0–0.1)
Basophils Relative: 1 %
Eosinophils Absolute: 0 10*3/uL (ref 0.0–0.5)
Eosinophils Relative: 0 %
HCT: 36.4 % (ref 36.0–46.0)
Hemoglobin: 11.8 g/dL — ABNORMAL LOW (ref 12.0–15.0)
Immature Granulocytes: 0 %
Lymphocytes Relative: 28 %
Lymphs Abs: 1.4 10*3/uL (ref 0.7–4.0)
MCH: 28.7 pg (ref 26.0–34.0)
MCHC: 32.4 g/dL (ref 30.0–36.0)
MCV: 88.6 fL (ref 80.0–100.0)
Monocytes Absolute: 0.3 10*3/uL (ref 0.1–1.0)
Monocytes Relative: 6 %
Neutro Abs: 3.3 10*3/uL (ref 1.7–7.7)
Neutrophils Relative %: 65 %
Platelets: 275 10*3/uL (ref 150–400)
RBC: 4.11 MIL/uL (ref 3.87–5.11)
RDW: 13.9 % (ref 11.5–15.5)
WBC: 5.1 10*3/uL (ref 4.0–10.5)
nRBC: 0 % (ref 0.0–0.2)

## 2021-09-14 LAB — PREGNANCY, URINE: Preg Test, Ur: NEGATIVE

## 2021-09-14 MED ORDER — DIPHENHYDRAMINE HCL 50 MG/ML IJ SOLN
25.0000 mg | Freq: Once | INTRAMUSCULAR | Status: AC
  Administered 2021-09-14: 25 mg via INTRAVENOUS
  Filled 2021-09-14: qty 1

## 2021-09-14 MED ORDER — LACTATED RINGERS IV BOLUS
1000.0000 mL | Freq: Once | INTRAVENOUS | Status: AC
  Administered 2021-09-14: 1000 mL via INTRAVENOUS

## 2021-09-14 MED ORDER — METOCLOPRAMIDE HCL 10 MG PO TABS: 10.0000 mg | ORAL_TABLET | Freq: Four times a day (QID) | ORAL | 0 refills | Status: AC | PRN

## 2021-09-14 MED ORDER — METOCLOPRAMIDE HCL 5 MG/ML IJ SOLN
10.0000 mg | Freq: Once | INTRAMUSCULAR | Status: AC
  Administered 2021-09-14: 10 mg via INTRAVENOUS
  Filled 2021-09-14: qty 2

## 2021-09-14 NOTE — ED Triage Notes (Signed)
Pt ambulatory to ER states tested positive for COVID on 9/10 and continues to  have headache and body aches.  Pt denies fever, endorses continued n/v/d.

## 2021-09-14 NOTE — ED Provider Notes (Signed)
[facility] EMERGENCY DEPARTMENT Provider Note   CSN: [id number] Arrival date & time: 09/14/21  0849     History Chief Complaint  Patient presents with   Headache   Generalized Body Aches    Teresa Vega is a 40 y.o. female.  HPI 40 year old female presents with headache and vomiting.  She has been experiencing all the symptoms since 9/10.  On that day she was diagnosed with COVID at the [place].  Since then she has been having headache, body aches, fatigue.  She has not had a fever or cough.  She has been having vomiting and diarrhea.  Symptoms all seem to get worse last night and the headache is now up to a 9 out of 10 and she continues to have emesis/diarrhea.  She denies blood in either.  She has been taking Tylenol.  No neck stiffness or vision changes.  She also notes some epigastric discomfort that she thinks is from soreness to her abdomen from vomiting.  Past Medical History:  Diagnosis Date   Anxiety    Depression    Diabetes mellitus without complication (HCC)    Hypertension    Thyroid disease     There are no problems to display for this patient.   Past Surgical History:  Procedure Laterality Date   LAPAROSCOPIC GASTRIC SLEEVE RESECTION       OB History   No obstetric history on file.     History reviewed. No pertinent family history.  Social History   Tobacco Use   Smoking status: Never   Smokeless tobacco: Never  Substance Use Topics   Alcohol use: Not Currently   Drug use: Never    Home Medications Prior to Admission medications   Medication Sig Start Date End Date Taking? Authorizing Provider  amLODipine-benazepril (LOTREL) 5-20 MG capsule Take 1 capsule by mouth daily. 11/10/19 10/07/20  [provider]  acetaminophen (TYLENOL 8 HOUR) 650 MG CR tablet Take 1 tablet (650 mg total) by mouth every 8 (eight) hours as needed. 12/10/19   Derwood Kaplan, MD  ASPIRIN LOW DOSE 81 MG EC tablet Take 81 mg by mouth daily. 08/11/20    [provider]  chlorthalidone (HYGROTON) 25 MG tablet Take 25 mg by mouth daily. 11/10/19   [provider]  levothyroxine (SYNTHROID) 125 MCG tablet Take 125 mcg by mouth daily. 08/11/20   [provider]  meloxicam (MOBIC) 7.5 MG tablet Take 7.5 mg by mouth daily. 09/08/20   [provider]  methocarbamol (ROBAXIN) 500 MG tablet Take 1 tablet (500 mg total) by mouth 2 (two) times daily. 12/10/19   Derwood Kaplan, MD  naproxen (NAPROSYN) 375 MG tablet Take 1 tablet (375 mg total) by mouth 2 (two) times daily. 12/10/19   Derwood Kaplan, MD  neomycin-polymyxin b-dexamethasone (MAXITROL) 3.5-10000-0.1 OINT SMARTSIG:Sparingly In Eye(s) Every Night PRN 09/19/20   [provider]  prednisoLONE acetate (PRED FORTE) 1 % ophthalmic suspension  09/19/20   [provider]  promethazine (PHENERGAN) 25 MG tablet Take 0.5-1 tablets (12.5-25 mg total) by mouth every 8 (eight) hours as needed for nausea. 01/02/20   Benjiman Core, MD  rosuvastatin (CRESTOR) 10 MG tablet Take 10 mg by mouth at bedtime. 10/03/20   [provider]  sertraline (ZOLOFT) 50 MG tablet Take 50 mg by mouth daily. 11/10/19   [provider]  SPRINTEC 28 0.25-35 MG-MCG tablet Take 1 tablet by mouth daily. 09/04/19   [provider]    Allergies  Sulfamethoxazole-trimethoprim and Ace inhibitors  Review of Systems   Review of Systems  Constitutional:  Positive for fatigue. Negative for fever.  Eyes:  Negative for visual disturbance.  Gastrointestinal:  Positive for abdominal pain, diarrhea, nausea and vomiting. Negative for blood in stool.  Musculoskeletal:  Positive for myalgias.  Neurological:  Positive for headaches.  All other systems reviewed and are negative.  Physical Exam Updated Vital Signs BP (!) 148/87   Pulse 79   Temp 98.5 F (36.9 C) (Oral)   Resp 18   Ht 5\' 7"  (1.702 m)   Wt 103.4 kg   LMP 08/23/2021 (Exact Date)   SpO2 100%    BMI 35.71 kg/m   Physical Exam Vitals and nursing note reviewed.  Constitutional:      Appearance: She is well-developed.  HENT:     Head: Normocephalic and atraumatic.     Right Ear: External ear normal.     Left Ear: External ear normal.     Nose: Nose normal.  Eyes:     General:        Right eye: No discharge.        Left eye: No discharge.     Extraocular Movements: Extraocular movements intact.     Pupils: Pupils are equal, round, and reactive to light.  Cardiovascular:     Rate and Rhythm: Normal rate and regular rhythm.     Heart sounds: Normal heart sounds.  Pulmonary:     Effort: Pulmonary effort is normal.     Breath sounds: Normal breath sounds.  Abdominal:     Palpations: Abdomen is soft.     Tenderness: There is no abdominal tenderness.  Musculoskeletal:     Cervical back: Normal range of motion and neck supple. No rigidity.  Skin:    General: Skin is warm and dry.  Neurological:     Mental Status: She is alert.     Comments: CN 3-12 grossly intact. 5/5 strength in all 4 extremities. Grossly normal sensation. Normal finger to nose.   Psychiatric:        Mood and Affect: Mood is not anxious.    ED Results / Procedures / Treatments   Labs (all labs ordered are listed, but only abnormal results are displayed) Labs Reviewed  COMPREHENSIVE METABOLIC PANEL - Abnormal; Notable for the following components:      Result Value   AST 13 (*)    All other components within normal limits  CBC WITH DIFFERENTIAL/PLATELET - Abnormal; Notable for the following components:   Hemoglobin 11.8 (*)    All other components within normal limits  LIPASE, BLOOD  PREGNANCY, URINE    EKG None  Radiology No results found.  Procedures Procedures   Medications Ordered in ED Medications  lactated ringers bolus 1,000 mL (0 mLs Intravenous Stopped 09/14/21 1216)  metoCLOPramide (REGLAN) injection 10 mg (10 mg Intravenous Given 09/14/21 1037)  diphenhydrAMINE (BENADRYL)  injection 25 mg (25 mg Intravenous Given 09/14/21 1036)    ED Course  I have reviewed the triage vital signs and the nursing notes.  Pertinent labs & imaging results that were available during my care of the patient were reviewed by me and considered in my medical decision making (see chart for details).    MDM Rules/Calculators/A&P                           Patient's exam is benign, both abdominal and neuro exams.  The patient  seems to be having continued symptoms since developing COVID about 6 days ago.  I think this is also related to COVID though it is a little unclear why she all of a sudden got worse.  However my suspicion for acute CNS emergency such as meningitis/encephalitis, head bleed, aneurysm, etc. is pretty low.  I do not think imaging is needed.  She is feeling much better after treatment in the ED.  She will be discharged home with return precautions. Final Clinical Impression(s) / ED Diagnoses Final diagnoses:  COVID-19    Rx / DC Orders ED Discharge Orders     None        Pricilla Loveless, MD 09/14/21 1249

## 2021-09-14 NOTE — Discharge Instructions (Signed)
If you develop continued, recurrent, or worsening headache, fever, neck stiffness, vomiting, blurry or double vision, weakness or numbness in your arms or legs, trouble speaking, or any other new/concerning symptoms then return to the ER for evaluation.  

## 2023-03-21 ENCOUNTER — Emergency Department (HOSPITAL_BASED_OUTPATIENT_CLINIC_OR_DEPARTMENT_OTHER): Payer: Federal, State, Local not specified - PPO

## 2023-03-21 ENCOUNTER — Other Ambulatory Visit: Payer: Self-pay

## 2023-03-21 ENCOUNTER — Encounter (HOSPITAL_BASED_OUTPATIENT_CLINIC_OR_DEPARTMENT_OTHER): Payer: Self-pay | Admitting: Emergency Medicine

## 2023-03-21 ENCOUNTER — Emergency Department (HOSPITAL_BASED_OUTPATIENT_CLINIC_OR_DEPARTMENT_OTHER)
Admission: EM | Admit: 2023-03-21 | Discharge: 2023-03-21 | Disposition: A | Discharge status: Home / Self Care | Payer: Federal, State, Local not specified - PPO | Attending: Emergency Medicine | Admitting: Emergency Medicine

## 2023-03-21 DIAGNOSIS — E119 Type 2 diabetes mellitus without complications: Secondary | ICD-10-CM | POA: Diagnosis not present

## 2023-03-21 DIAGNOSIS — Z1152 Encounter for screening for COVID-19: Secondary | ICD-10-CM | POA: Diagnosis not present

## 2023-03-21 DIAGNOSIS — Z79899 Other long term (current) drug therapy: Secondary | ICD-10-CM | POA: Insufficient documentation

## 2023-03-21 DIAGNOSIS — R059 Cough, unspecified: Secondary | ICD-10-CM | POA: Diagnosis present

## 2023-03-21 DIAGNOSIS — I10 Essential (primary) hypertension: Secondary | ICD-10-CM | POA: Insufficient documentation

## 2023-03-21 DIAGNOSIS — J209 Acute bronchitis, unspecified: Secondary | ICD-10-CM | POA: Diagnosis not present

## 2023-03-21 DIAGNOSIS — Z7982 Long term (current) use of aspirin: Secondary | ICD-10-CM | POA: Diagnosis not present

## 2023-03-21 DIAGNOSIS — Z7984 Long term (current) use of oral hypoglycemic drugs: Secondary | ICD-10-CM | POA: Insufficient documentation

## 2023-03-21 HISTORY — DX: Type 2 diabetes mellitus without complications: E11.9

## 2023-03-21 LAB — RESP PANEL BY RT-PCR (RSV, FLU A&B, COVID)  RVPGX2
Influenza A by PCR: NEGATIVE
Influenza B by PCR: NEGATIVE
Resp Syncytial Virus by PCR: NEGATIVE
SARS Coronavirus 2 by RT PCR: NEGATIVE

## 2023-03-21 MED ORDER — AZITHROMYCIN 250 MG PO TABS: 250.0000 mg | ORAL_TABLET | Freq: Every day | ORAL | 0 refills | Status: AC

## 2023-03-21 MED ORDER — FLUTICASONE PROPIONATE HFA 44 MCG/ACT IN AERO
2.0000 | INHALATION_SPRAY | Freq: Two times a day (BID) | RESPIRATORY_TRACT | Status: DC
  Administered 2023-03-21: 2 via RESPIRATORY_TRACT
  Filled 2023-03-21: qty 10.6

## 2023-03-21 MED ORDER — ALBUTEROL SULFATE HFA 108 (90 BASE) MCG/ACT IN AERS
2.0000 | INHALATION_SPRAY | RESPIRATORY_TRACT | Status: DC | PRN
  Administered 2023-03-21: 2 via RESPIRATORY_TRACT
  Filled 2023-03-21: qty 6.7

## 2023-03-21 NOTE — ED Provider Notes (Signed)
[facility] Provider Note: Georgena Spurling, MD, FACEP  CSN: [id number] MRN: [id number] ARRIVAL: 03/21/23 at [place]: [place]  03/21/23 4:20 AM Teresa Vega is a 42 y.o. female with a couple of weeks of URI symptoms.  Specifically she has had cough, nasal congestion, loss of taste and smell, and shortness of breath.  She has tested negative for COVID 4 times.  She is also having pain (soreness) in her chest when she coughs, she rates it as an 8 out of 10.  Deep breathing exacerbates her cough.   Past Medical History:  Diagnosis Date   Anxiety    Depression    DM (diabetes mellitus) ([place])    Hypertension    Thyroid disease     Past Surgical History:  Procedure Laterality Date   LAPAROSCOPIC GASTRIC SLEEVE RESECTION      History reviewed. No pertinent family history.  Social History   Tobacco Use   Smoking status: Never   Smokeless tobacco: Never  Vaping Use   Vaping Use: Never used  Substance Use Topics   Alcohol use: Not Currently   Drug use: Never    Prior to Admission medications   Medication Sig Start Date End Date Taking? Authorizing Provider  azithromycin (ZITHROMAX) 250 MG tablet Take 1 tablet (250 mg total) by mouth daily. Take first 2 tablets together, then 1 every day until finished. 03/21/23  Yes Hugh Garrow, MD  amLODipine-benazepril (LOTREL) 5-20 MG capsule Take 1 capsule by mouth daily. 11/10/19 10/07/20  [provider]  acetaminophen (TYLENOL 8 HOUR) 650 MG CR tablet Take 1 tablet (650 mg total) by mouth every 8 (eight) hours as needed. 12/10/19   Varney Biles, MD  ASPIRIN LOW DOSE 81 MG EC tablet Take 81 mg by mouth daily. 08/11/20   [provider]  chlorthalidone (HYGROTON) 25 MG tablet Take 25 mg by mouth daily. 11/10/19   [provider]  levothyroxine (SYNTHROID) 125 MCG tablet Take 125 mcg by mouth daily. 08/11/20   [provider]   meloxicam (MOBIC) 7.5 MG tablet Take 7.5 mg by mouth daily. 09/08/20   [provider]  methocarbamol (ROBAXIN) 500 MG tablet Take 1 tablet (500 mg total) by mouth 2 (two) times daily. 12/10/19   Varney Biles, MD  metoCLOPramide (REGLAN) 10 MG tablet Take 1 tablet (10 mg total) by mouth every 6 (six) hours as needed for nausea (nausea/headache). 09/14/21   Sherwood Gambler, MD  naproxen (NAPROSYN) 375 MG tablet Take 1 tablet (375 mg total) by mouth 2 (two) times daily. 12/10/19   Varney Biles, MD  neomycin-polymyxin b-dexamethasone (MAXITROL) 3.5-10000-0.1 OINT SMARTSIG:Sparingly In Eye(s) Every Night PRN 09/19/20   [provider]  prednisoLONE acetate (PRED FORTE) 1 % ophthalmic suspension  09/19/20   [provider]  promethazine (PHENERGAN) 25 MG tablet Take 0.5-1 tablets (12.5-25 mg total) by mouth every 8 (eight) hours as needed for nausea. 01/02/20   Davonna Belling, MD  rosuvastatin (CRESTOR) 10 MG tablet Take 10 mg by mouth at bedtime. 10/03/20   [provider]  sertraline (ZOLOFT) 50 MG tablet Take 50 mg by mouth daily. 11/10/19   [provider]  [place] 28 0.25-35 MG-MCG tablet Take 1 tablet by mouth daily. 09/04/19   [provider]    Allergies Sulfamethoxazole-trimethoprim and Ace inhibitors   REVIEW OF SYSTEMS  Negative except as noted here or in the History of Present Illness.  PHYSICAL EXAMINATION  Initial Vital Signs Blood pressure 137/87, pulse 93, temperature 97.9 F (36.6 C), temperature source Oral, resp. rate 18, height 5\' 7"  (1.702 m), weight 117.9 kg, last menstrual period 02/28/2023, SpO2 100 %.  Examination General: Well-developed, well-nourished female in no acute distress; appearance consistent with age of record HENT: normocephalic; atraumatic Eyes: Normal appearance Neck: supple Heart: regular rate and rhythm Lungs: Decreased air movement bilaterally without frank wheezing; coughing on deep  breathing Abdomen: soft; nondistended; nontender; bowel sounds present Extremities: No deformity; full range of motion Neurologic: Awake, alert and oriented; motor function intact in all extremities and symmetric; no facial droop Skin: Warm and dry Psychiatric: Normal mood and affect   RESULTS  Summary of this visit's results, reviewed and interpreted by myself:   EKG Interpretation  Date/Time:    Ventricular Rate:    PR Interval:    QRS Duration:   QT Interval:    QTC Calculation:   R Axis:     Text Interpretation:         Laboratory Studies: Results for orders placed or performed during the hospital encounter of 03/21/23 (from the past 24 hour(s))  Resp panel by RT-PCR (RSV, Flu A&B, Covid) Anterior Nasal Swab     Status: None   Collection Time: 03/21/23  4:19 AM   Specimen: Anterior Nasal Swab  Result Value Ref Range   SARS Coronavirus 2 by RT PCR NEGATIVE NEGATIVE   Influenza A by PCR NEGATIVE NEGATIVE   Influenza B by PCR NEGATIVE NEGATIVE   Resp Syncytial Virus by PCR NEGATIVE NEGATIVE   Imaging Studies: DG Chest 2 View  Result Date: 03/21/2023 CLINICAL DATA:  42 year old female with shortness of breath and cough for 2 weeks. EXAM: CHEST - 2 VIEW COMPARISON:  Chest CTA 01/02/2020 and earlier. FINDINGS: PA and lateral views at 0435 hours. Low normal lung volumes. Normal cardiac size and mediastinal contours. Visualized tracheal air column is within normal limits. No pneumothorax, pulmonary edema, pleural effusion or consolidation. Borderline to mild increased mostly upper lobe interstitial markings more conspicuous on the right. No acute osseous abnormality identified. Mid and lower thoracic disc and endplate degeneration. Negative visible bowel gas. IMPRESSION: Borderline to mild increased interstitial markings raise the possibility of acute viral/atypical respiratory infection. No pleural effusion or other acute cardiopulmonary abnormality. Electronically Signed   By: Genevie Ann M.D.   On: 03/21/2023 04:43    ED COURSE and MDM  Nursing notes, initial and subsequent vitals signs, including pulse oximetry, reviewed and interpreted by myself.  Vitals:   03/21/23 0417 03/21/23 0419 03/21/23 0437  BP: 137/87    Pulse: 93    Resp: 18    Temp: 97.9 F (36.6 C)    TempSrc: Oral    SpO2: 100%  96%  Weight:  117.9 kg   Height:  5\' 7"  (1.702 m)    Medications  albuterol (VENTOLIN HFA) 108 (90 Base) MCG/ACT inhaler 2 puff (2 puffs Inhalation Given 03/21/23 0437)  fluticasone (FLOVENT HFA) 44 MCG/ACT inhaler 2 puff (has no administration in time range)   5:08 AM Patient given albuterol inhaler and AeroChamber and instructed in their use.  Air movement has improved and she can take a deep breath now without coughing.  We will avoid systemic steroids as she is diabetic but we will provide a Flovent inhaler.  Her radiographic findings suggest a possible atypical respiratory infection which may benefit from a course of azithromycin.   PROCEDURES  Procedures   ED  DIAGNOSES     ICD-10-CM   1. Acute bronchitis with bronchospasm  J20.9          Leticia Mcdiarmid, Jenny Reichmann, MD 03/21/23 [phone]

## 2023-03-21 NOTE — ED Triage Notes (Signed)
Pt states she has had congestion, cough, loss of taste and smell, and shortness of breath  Pt states sxs started a couple weeks ago  Pt has been tested for covid x 4, all of which have been negative

## 2023-08-08 ENCOUNTER — Other Ambulatory Visit: Payer: Self-pay

## 2023-08-08 ENCOUNTER — Emergency Department (HOSPITAL_BASED_OUTPATIENT_CLINIC_OR_DEPARTMENT_OTHER)
Admission: EM | Admit: 2023-08-08 | Discharge: 2023-08-08 | Disposition: A | Discharge status: Home / Self Care | Payer: Federal, State, Local not specified - PPO | Attending: Emergency Medicine | Admitting: Emergency Medicine

## 2023-08-08 ENCOUNTER — Emergency Department (HOSPITAL_BASED_OUTPATIENT_CLINIC_OR_DEPARTMENT_OTHER): Payer: Federal, State, Local not specified - PPO

## 2023-08-08 DIAGNOSIS — Z7982 Long term (current) use of aspirin: Secondary | ICD-10-CM | POA: Diagnosis not present

## 2023-08-08 DIAGNOSIS — M7652 Patellar tendinitis, left knee: Secondary | ICD-10-CM | POA: Insufficient documentation

## 2023-08-08 DIAGNOSIS — M25562 Pain in left knee: Secondary | ICD-10-CM

## 2023-08-08 MED ORDER — DICLOFENAC SODIUM 1 % EX GEL: 4.0000 g | Freq: Four times a day (QID) | CUTANEOUS | 1 refills | Status: AC

## 2023-08-08 NOTE — ED Triage Notes (Signed)
Left knee pain x 3 weeks , today the pain starts pulsating , warm to touch and swelling .  Denies Hx DVT , no shortness of breath oir chest pain .  Discomfort with  ambulation noted . No fall or injury .

## 2023-08-08 NOTE — ED Notes (Signed)
Discharge paperwork reviewed entirely with patient, including follow up care. Pain was under control. The patient received instruction and coaching on their prescriptions, and all follow-up questions were answered.  Pt verbalized understanding as well as all parties involved. No questions or concerns voiced at the time of discharge. No acute distress noted.   Pt ambulated out to PVA without incident or assistance.  

## 2023-08-08 NOTE — ED Provider Notes (Signed)
[facility] AT [facility] Provider Note   CSN: [id number] Arrival date & time: 08/08/23  1709     History  Chief Complaint  Patient presents with   Knee Pain    Teresa Vega is a 42 y.o. female.  Patient with history of gastric sleeve surgery presents to the emergency department today for evaluation of left knee pain and swelling.  Symptoms started about 2 weeks ago but have gradually worsened.  Pain is described as throbbing and pulsating.  She denies acute injury at onset.  She denies a history of rheumatoid arthritis or gout.  No fevers.  She is able to flex and extend the knee but with pain.  She has been taking Tylenol for her symptoms.       Home Medications Prior to Admission medications   Medication Sig Start Date End Date Taking? Authorizing Provider  amLODipine-benazepril (LOTREL) 5-20 MG capsule Take 1 capsule by mouth daily. 11/10/19 10/07/20  [provider]  acetaminophen (TYLENOL 8 HOUR) 650 MG CR tablet Take 1 tablet (650 mg total) by mouth every 8 (eight) hours as needed. 12/10/19   Derwood Kaplan, MD  ASPIRIN LOW DOSE 81 MG EC tablet Take 81 mg by mouth daily. 08/11/20   [provider]  azithromycin (ZITHROMAX) 250 MG tablet Take 1 tablet (250 mg total) by mouth daily. Take first 2 tablets together, then 1 every day until finished. 03/21/23   Molpus, John, MD  chlorthalidone (HYGROTON) 25 MG tablet Take 25 mg by mouth daily. 11/10/19   [provider]  levothyroxine (SYNTHROID) 125 MCG tablet Take 125 mcg by mouth daily. 08/11/20   [provider]  meloxicam (MOBIC) 7.5 MG tablet Take 7.5 mg by mouth daily. 09/08/20   [provider]  methocarbamol (ROBAXIN) 500 MG tablet Take 1 tablet (500 mg total) by mouth 2 (two) times daily. 12/10/19   Derwood Kaplan, MD  metoCLOPramide (REGLAN) 10 MG tablet Take 1 tablet (10 mg total) by mouth every 6 (six) hours as needed for nausea  (nausea/headache). 09/14/21   Pricilla Loveless, MD  naproxen (NAPROSYN) 375 MG tablet Take 1 tablet (375 mg total) by mouth 2 (two) times daily. 12/10/19   Derwood Kaplan, MD  neomycin-polymyxin b-dexamethasone (MAXITROL) 3.5-10000-0.1 OINT SMARTSIG:Sparingly In Eye(s) Every Night PRN 09/19/20   [provider]  prednisoLONE acetate (PRED FORTE) 1 % ophthalmic suspension  09/19/20   [provider]  promethazine (PHENERGAN) 25 MG tablet Take 0.5-1 tablets (12.5-25 mg total) by mouth every 8 (eight) hours as needed for nausea. 01/02/20   Benjiman Core, MD  rosuvastatin (CRESTOR) 10 MG tablet Take 10 mg by mouth at bedtime. 10/03/20   [provider]  sertraline (ZOLOFT) 50 MG tablet Take 50 mg by mouth daily. 11/10/19   [provider]  [place] 28 0.25-35 MG-MCG tablet Take 1 tablet by mouth daily. 09/04/19   [provider]      Allergies    Sulfamethoxazole-trimethoprim and Ace inhibitors    Review of Systems   Review of Systems  Physical Exam Updated Vital Signs BP (!) 148/104   Pulse 89   Temp 98 F (36.7 C) (Oral)   Resp (!) 24   Wt 113.4 kg   LMP 07/16/2023 (Exact Date)   SpO2 99%   BMI 39.16 kg/m  Physical Exam Vitals and nursing note reviewed.  Constitutional:      Appearance: She is well-developed.  HENT:     Head: Normocephalic and atraumatic.  Eyes:     Conjunctiva/sclera: Conjunctivae normal.  Pulmonary:     Effort: No respiratory distress.  Musculoskeletal:     Cervical back: Normal range of motion and neck supple.     Left upper leg: No tenderness.     Right knee: No effusion. Normal range of motion. No tenderness.     Left knee: Effusion (Mild) present. Normal range of motion. Tenderness present over the medial joint line. No lateral joint line tenderness.     Comments: 2+ DP pulses bilaterally, distal sensation intact.  Skin:    General: Skin is warm and dry.  Neurological:     Mental Status: She is alert.      ED Results / Procedures / Treatments   Labs (all labs ordered are listed, but only abnormal results are displayed) Labs Reviewed - No data to display  EKG None  Radiology DG Knee Complete 4 Views Left  Result Date: 08/08/2023 CLINICAL DATA:  Knee pain and swelling. Anterior knee pain for 3 weeks. EXAM: LEFT KNEE - COMPLETE 4+ VIEW COMPARISON:  None Available. FINDINGS: The left knee is located. No acute osseous abnormalities are present. Mild tricompartmental degenerative changes are noted. No significant effusion is present. Mild thickening of the patellar tendon is noted on the lateral view. No radiopaque foreign body is present. IMPRESSION: 1. 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 2. Mild thickening of the patellar tendon. Question tendinopathy. Electronically Signed   By: Marin Roberts M.D.   On: 08/08/2023 18:33   US Venous Img Lower  Left (DVT Study)  Result Date: 08/08/2023 CLINICAL DATA:  Anterior knee pain and swelling. Symptoms for 1 month. EXAM: Left LOWER EXTREMITY VENOUS DOPPLER ULTRASOUND TECHNIQUE: Gray-scale sonography with compression, as well as color and duplex ultrasound, were performed to evaluate the deep venous system(s) from the level of the common femoral vein through the popliteal and proximal calf veins. COMPARISON:  None Available. FINDINGS: VENOUS Normal compressibility of the common femoral, superficial femoral, and popliteal veins, as well as the visualized calf veins. Visualized portions of profunda femoral vein and great saphenous vein unremarkable. No filling defects to suggest DVT on grayscale or color Doppler imaging. Doppler waveforms show normal direction of venous flow, normal respiratory plasticity and response to augmentation. Limited views of the contralateral common femoral vein are unremarkable. OTHER Imaging of the anterior patellar tendon was performed. Edema and thickening of the patellar tendon is noted. Limitations:  none IMPRESSION: 1. No evidence of left lower extremity DVT. 2. Edema and thickening of the patellar tendon. Electronically Signed   By: Marin Roberts M.D.   On: 08/08/2023 18:29    Procedures Procedures    Medications Ordered in ED Medications - No data to display  ED Course/ Medical Decision Making/ A&P    Patient seen and examined. History obtained directly from patient.   Labs/EKG: None ordered  Imaging: Ordered plain film left knee, DVT study of the left leg.  Medications/Fluids: None ordered  Most recent vital signs reviewed and are as follows: BP (!) 148/104   Pulse 89   Temp 98 F (36.7 C) (Oral)   Resp (!) 24   Wt 113.4 kg   LMP 07/16/2023 (Exact Date)   SpO2 99%   BMI 39.16 kg/m   Initial impression: Left knee pain  6:46 PM Reassessment performed. Patient appears stable, comfortable.  Imaging personally visualized and interpreted including: X-ray of the left knee agree no fracture, mild degenerative changes noted; ultrasound results reviewed, no  DVT noted.  No Baker's cyst.  Reviewed pertinent lab work and imaging with patient at bedside. Questions answered.   Most current vital signs reviewed and are as follows: BP (!) 148/104   Pulse 89   Temp 98 F (36.7 C) (Oral)   Resp (!) 24   Wt 113.4 kg   LMP 07/16/2023 (Exact Date)   SpO2 99%   BMI 39.16 kg/m   Plan: Discharge to home.   Prescriptions written for: Voltaren gel, patient should likely avoid course of NSAIDs due to history of sleeve gastrectomy, however discussed that she could potentially discuss this further with her care team.  Other home care instructions discussed: RICE protocol, avoidance of activities which makes the symptoms worse  ED return instructions discussed: Worsening pain, swelling, fever  Follow-up instructions discussed: Patient encouraged to follow-up with orthopedist in 5 days.                                  Medical Decision Making Amount and/or Complexity  of Data Reviewed Radiology: ordered.    Patient with left knee pain and swelling.  X-ray with mild degenerative changes.  DVT study negative for DVT but shows thickened patellar tendon.  This is likely the cause of the patient's symptoms.  Do not suspect septic arthritis.  Lower extremity is neurovascularly intact.        Final Clinical Impression(s) / ED Diagnoses Final diagnoses:  Patellar tendinitis of left knee  Acute pain of left knee    Rx / DC Orders ED Discharge Orders          Ordered    diclofenac Sodium (VOLTAREN) 1 % GEL  4 times daily        08/08/23 1843              Renne Crigler, PA-C 08/08/23 1848    Maia Plan, MD 08/12/23 [phone]

## 2023-08-08 NOTE — Discharge Instructions (Signed)
The ultrasound did not show any signs of blood clot.  Both the x-ray and the ultrasound suggests inflammation of the patellar tendon, potentially indicating tendinitis.  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  Use the topical anti-inflammatory medication as prescribed and continue oral Tylenol for more significant pain.  Follow-up with the orthopedist when able.
